# Patient Record
Sex: Male | Born: 1985 | Race: White | Hispanic: No | Marital: Married | Smoking: Current every day smoker
Health system: Southern US, Community
[De-identification: ages and names within clinical notes are randomized; demographics above are authoritative.]

## PROBLEM LIST (undated history)

## (undated) DIAGNOSIS — Z8719 Personal history of other diseases of the digestive system: Secondary | ICD-10-CM

## (undated) DIAGNOSIS — F419 Anxiety disorder, unspecified: Secondary | ICD-10-CM

## (undated) HISTORY — PX: NO PAST SURGERIES: SHX2092

---

## 2014-08-15 ENCOUNTER — Emergency Department (HOSPITAL_COMMUNITY)
Admission: EM | Admit: 2014-08-15 | Discharge: 2014-08-15 | Payer: Self-pay | Attending: Emergency Medicine | Admitting: Emergency Medicine

## 2014-08-15 ENCOUNTER — Encounter (HOSPITAL_COMMUNITY): Payer: Self-pay | Admitting: Emergency Medicine

## 2014-08-15 DIAGNOSIS — S0181XA Laceration without foreign body of other part of head, initial encounter: Secondary | ICD-10-CM | POA: Insufficient documentation

## 2014-08-15 DIAGNOSIS — R42 Dizziness and giddiness: Secondary | ICD-10-CM | POA: Insufficient documentation

## 2014-08-15 DIAGNOSIS — Z720 Tobacco use: Secondary | ICD-10-CM | POA: Insufficient documentation

## 2014-08-15 DIAGNOSIS — T07XXXA Unspecified multiple injuries, initial encounter: Secondary | ICD-10-CM

## 2014-08-15 MED ORDER — ACETAMINOPHEN 325 MG PO TABS
650.0000 mg | ORAL_TABLET | Freq: Once | ORAL | Status: AC
  Administered 2014-08-15: 650 mg via ORAL
  Filled 2014-08-15: qty 2

## 2014-08-15 MED ORDER — LIDOCAINE-EPINEPHRINE (PF) 2 %-1:200000 IJ SOLN
10.0000 mL | Freq: Once | INTRAMUSCULAR | Status: AC
  Administered 2014-08-15: 10 mL
  Filled 2014-08-15: qty 10

## 2014-08-15 NOTE — ED Notes (Signed)
Irving BurtonEmily, PA at bedside to suture.
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
## 2014-08-15 NOTE — Discharge Instructions (Signed)
Read the information below.  You may return to the Emergency Department at any time for worsening condition or any new symptoms that concern you.  If you develop redness, swelling, pus draining from the wound, or fevers greater than 100.4, return to the ER immediately for a recheck.    Facial Laceration A facial laceration is a cut on the face. These injuries can be painful and cause bleeding. Some cuts may need to be closed with stitches (sutures), skin adhesive strips, or wound glue. Cuts usually heal quickly but can leave a scar. It can take 1-2 years for the scar to go away completely. HOME CARE   Only take medicines as told by your doctor.  Follow your doctor's instructions for wound care. For Stitches:  Keep the cut clean and dry.  If you have a bandage (dressing), change it at least once a day. Change the bandage if it gets wet or dirty, or as told by your doctor.  Wash the cut with soap and water 2 times a day. Rinse the cut with water. Pat it dry with a clean towel.  Put a thin layer of medicated cream on the cut as told by your doctor.  You may shower after the first 24 hours. Do not soak the cut in water until the stitches are removed.  Have your stitches removed as told by your doctor.  Do not wear any makeup until a few days after your stitches are removed. For Skin Adhesive Strips:  Keep the cut clean and dry.  Do not get the strips wet. You may take a bath, but be careful to keep the cut dry.  If the cut gets wet, pat it dry with a clean towel.  The strips will fall off on their own. Do not remove the strips that are still stuck to the cut. For Wound Glue:  You may shower or take baths. Do not soak or scrub the cut. Do not swim. Avoid heavy sweating until the glue falls off on its own. After a shower or bath, pat the cut dry with a clean towel.  Do not put medicine or makeup on your cut until the glue falls off.  If you have a bandage, do not put tape over the  glue.  Avoid lots of sunlight or tanning lamps until the glue falls off.  The glue will fall off on its own in 5-10 days. Do not pick at the glue. After Healing: Put sunscreen on the cut for the first year to reduce your scar. GET HELP RIGHT AWAY IF:   Your cut area gets red, painful, or puffy (swollen).  You see a yellowish-white fluid (pus) coming from the cut.  You have chills or a fever. MAKE SURE YOU:   Understand these instructions.  Will watch your condition.  Will get help right away if you are not doing well or get worse. Document Released: 03/29/2008 Document Revised: 08/01/2013 Document Reviewed: 05/24/2013 [facility] Patient Information 2015 [place], [place]. This information is not intended to replace advice given to you by your health care provider. Make sure you discuss any questions you have with your health care provider.  Laceration Care, Adult A laceration is a cut that goes through all layers of the skin. The cut goes into the tissue beneath the skin. HOME CARE For stitches (sutures) or staples:  Keep the cut clean and dry.  If you have a bandage (dressing), change it at least once a day. Change the bandage if  it gets wet or dirty, or as told by your doctor.  Wash the cut with soap and water 2 times a day. Rinse the cut with water. Pat it dry with a clean towel.  Put a thin layer of medicated cream on the cut as told by your doctor.  You may shower after the first 24 hours. Do not soak the cut in water until the stitches are removed.  Only take medicines as told by your doctor.  Have your stitches or staples removed as told by your doctor. For skin adhesive strips:  Keep the cut clean and dry.  Do not get the strips wet. You may take a bath, but be careful to keep the cut dry.  If the cut gets wet, pat it dry with a clean towel.  The strips will fall off on their own. Do not remove the strips that are still stuck to the cut. For wound glue:  You  may shower or take baths. Do not soak or scrub the cut. Do not swim. Avoid heavy sweating until the glue falls off on its own. After a shower or bath, pat the cut dry with a clean towel.  Do not put medicine on your cut until the glue falls off.  If you have a bandage, do not put tape over the glue.  Avoid lots of sunlight or tanning lamps until the glue falls off. Put sunscreen on the cut for the first year to reduce your scar.  The glue will fall off on its own. Do not pick at the glue. You may need a tetanus shot if:  You cannot remember when you had your last tetanus shot.  You have never had a tetanus shot. If you need a tetanus shot and you choose not to have one, you may get tetanus. Sickness from tetanus can be serious. GET HELP RIGHT AWAY IF:   Your pain does not get better with medicine.  Your arm, hand, leg, or foot loses feeling (numbness) or changes color.  Your cut is bleeding.  Your joint feels weak, or you cannot use your joint.  You have painful lumps on your body.  Your cut is red, puffy (swollen), or painful.  You have a red line on the skin near the cut.  You have yellowish-white fluid (pus) coming from the cut.  You have a fever.  You have a bad smell coming from the cut or bandage.  Your cut breaks open before or after stitches are removed.  You notice something coming out of the cut, such as wood or glass.  You cannot move a finger or toe. MAKE SURE YOU:   Understand these instructions.  Will watch your condition.  Will get help right away if you are not doing well or get worse. Document Released: 03/29/2008 Document Revised: 01/03/2012 Document Reviewed: 04/06/2011 [facility] Patient Information 2015 [place], [place]. This information is not intended to replace advice given to you by your health care provider. Make sure you discuss any questions you have with your health care provider.

## 2014-08-15 NOTE — ED Provider Notes (Signed)
Medical screening examination/treatment/procedure(s) were performed by non-physician practitioner and as supervising physician I was immediately available for consultation/collaboration.   EKG Interpretation None        [name], MD 08/15/14 2354

## 2015-03-14 ENCOUNTER — Encounter (HOSPITAL_COMMUNITY): Payer: Self-pay | Admitting: *Deleted

## 2015-03-14 ENCOUNTER — Inpatient Hospital Stay (HOSPITAL_COMMUNITY)
Admission: AD | Admit: 2015-03-14 | Discharge: 2015-03-18 | DRG: 880 | Disposition: A | Discharge status: Home / Self Care | Payer: Federal, State, Local not specified - Other | Source: Intra-hospital | Attending: Psychiatry | Admitting: Psychiatry

## 2015-03-14 ENCOUNTER — Emergency Department (HOSPITAL_COMMUNITY)
Admission: EM | Admit: 2015-03-14 | Discharge: 2015-03-14 | Disposition: A | Discharge status: Other Acute Inpatient Hospital | Payer: Self-pay

## 2015-03-14 DIAGNOSIS — R45850 Homicidal ideations: Secondary | ICD-10-CM | POA: Diagnosis present

## 2015-03-14 DIAGNOSIS — F419 Anxiety disorder, unspecified: Secondary | ICD-10-CM | POA: Diagnosis present

## 2015-03-14 DIAGNOSIS — F411 Generalized anxiety disorder: Secondary | ICD-10-CM | POA: Diagnosis not present

## 2015-03-14 DIAGNOSIS — F17210 Nicotine dependence, cigarettes, uncomplicated: Secondary | ICD-10-CM | POA: Diagnosis present

## 2015-03-14 DIAGNOSIS — G4700 Insomnia, unspecified: Secondary | ICD-10-CM | POA: Diagnosis present

## 2015-03-14 DIAGNOSIS — F410 Panic disorder [episodic paroxysmal anxiety] without agoraphobia: Secondary | ICD-10-CM | POA: Diagnosis present

## 2015-03-14 DIAGNOSIS — F4310 Post-traumatic stress disorder, unspecified: Secondary | ICD-10-CM | POA: Diagnosis present

## 2015-03-14 DIAGNOSIS — F319 Bipolar disorder, unspecified: Secondary | ICD-10-CM

## 2015-03-14 DIAGNOSIS — Z720 Tobacco use: Secondary | ICD-10-CM | POA: Insufficient documentation

## 2015-03-14 HISTORY — DX: Anxiety disorder, unspecified: F41.9

## 2015-03-14 HISTORY — DX: Personal history of other diseases of the digestive system: Z87.19

## 2015-03-14 LAB — CBC WITH DIFFERENTIAL/PLATELET
Basophils Absolute: 0 K/uL (ref 0.0–0.1)
Basophils Relative: 0 % (ref 0–1)
Eosinophils Absolute: 0 K/uL (ref 0.0–0.7)
Eosinophils Relative: 0 % (ref 0–5)
HCT: 42.2 % (ref 39.0–52.0)
Hemoglobin: 14.5 g/dL (ref 13.0–17.0)
Lymphocytes Relative: 12 % (ref 12–46)
Lymphs Abs: 1.1 K/uL (ref 0.7–4.0)
MCH: 32.3 pg (ref 26.0–34.0)
MCHC: 34.4 g/dL (ref 30.0–36.0)
MCV: 94 fL (ref 78.0–100.0)
Monocytes Absolute: 0.7 K/uL (ref 0.1–1.0)
Monocytes Relative: 8 % (ref 3–12)
Neutro Abs: 7.1 K/uL (ref 1.7–7.7)
Neutrophils Relative %: 80 % — ABNORMAL HIGH (ref 43–77)
Platelets: 252 K/uL (ref 150–400)
RBC: 4.49 MIL/uL (ref 4.22–5.81)
RDW: 13.3 % (ref 11.5–15.5)
WBC: 8.9 K/uL (ref 4.0–10.5)

## 2015-03-14 LAB — COMPREHENSIVE METABOLIC PANEL WITH GFR
ALT: 21 U/L (ref 17–63)
AST: 24 U/L (ref 15–41)
Albumin: 4 g/dL (ref 3.5–5.0)
Alkaline Phosphatase: 52 U/L (ref 38–126)
Anion gap: 12 (ref 5–15)
BUN: 10 mg/dL (ref 6–20)
CO2: 22 mmol/L (ref 22–32)
Calcium: 8.9 mg/dL (ref 8.9–10.3)
Chloride: 106 mmol/L (ref 101–111)
Creatinine, Ser: 1.13 mg/dL (ref 0.61–1.24)
GFR calc Af Amer: >60 mL/min
GFR calc non Af Amer: >60 mL/min
Glucose, Bld: 88 mg/dL (ref 65–99)
Potassium: 3.7 mmol/L (ref 3.5–5.1)
Sodium: 140 mmol/L (ref 135–145)
Total Bilirubin: 0.6 mg/dL (ref 0.3–1.2)
Total Protein: 7.1 g/dL (ref 6.5–8.1)

## 2015-03-14 LAB — RAPID URINE DRUG SCREEN, HOSP PERFORMED
Amphetamines: NOT DETECTED
BARBITURATES: NOT DETECTED
BENZODIAZEPINES: NOT DETECTED
COCAINE: NOT DETECTED
Opiates: NOT DETECTED
Tetrahydrocannabinol: POSITIVE — AB

## 2015-03-14 LAB — ETHANOL: Alcohol, Ethyl (B): <5 mg/dL (ref <5)

## 2015-03-14 LAB — VALPROIC ACID LEVEL: VALPROIC ACID LVL: 30 ug/mL — AB (ref 50.0–100.0)

## 2015-03-14 MED ORDER — BUSPIRONE HCL 10 MG PO TABS
10.0000 mg | ORAL_TABLET | Freq: Two times a day (BID) | ORAL | Status: DC
  Administered 2015-03-14 – 2015-03-15 (×2): 10 mg via ORAL
  Filled 2015-03-14: qty 2
  Filled 2015-03-14 (×6): qty 1

## 2015-03-14 MED ORDER — NICOTINE 21 MG/24HR TD PT24
21.0000 mg | MEDICATED_PATCH | Freq: Every day | TRANSDERMAL | Status: DC
  Administered 2015-03-15 – 2015-03-17 (×3): 21 mg via TRANSDERMAL
  Filled 2015-03-14 (×6): qty 1

## 2015-03-14 MED ORDER — ALUM & MAG HYDROXIDE-SIMETH 200-200-20 MG/5ML PO SUSP: 30.0000 mL | ORAL | Status: DC | PRN

## 2015-03-14 MED ORDER — ACETAMINOPHEN 325 MG PO TABS: 650.0000 mg | ORAL_TABLET | ORAL | Status: DC | PRN

## 2015-03-14 MED ORDER — TRAZODONE HCL 50 MG PO TABS: 50.0000 mg | ORAL_TABLET | Freq: Every evening | ORAL | Status: DC | PRN

## 2015-03-14 MED ORDER — DIVALPROEX SODIUM 250 MG PO DR TAB
250.0000 mg | DELAYED_RELEASE_TABLET | Freq: Two times a day (BID) | ORAL | Status: DC
  Administered 2015-03-14: 250 mg via ORAL
  Filled 2015-03-14: qty 1

## 2015-03-14 MED ORDER — BUSPIRONE HCL 10 MG PO TABS
10.0000 mg | ORAL_TABLET | Freq: Two times a day (BID) | ORAL | Status: DC
  Administered 2015-03-14: 10 mg via ORAL
  Filled 2015-03-14: qty 1

## 2015-03-14 MED ORDER — TRAZODONE HCL 50 MG PO TABS: 150.0000 mg | ORAL_TABLET | Freq: Every day | ORAL | Status: DC

## 2015-03-14 MED ORDER — HYDROXYZINE HCL 25 MG PO TABS: 25.0000 mg | ORAL_TABLET | ORAL | Status: DC | PRN

## 2015-03-14 MED ORDER — MAGNESIUM HYDROXIDE 400 MG/5ML PO SUSP: 30.0000 mL | Freq: Every day | ORAL | Status: DC | PRN

## 2015-03-14 MED ORDER — ACETAMINOPHEN 325 MG PO TABS
650.0000 mg | ORAL_TABLET | Freq: Four times a day (QID) | ORAL | Status: DC | PRN
  Administered 2015-03-15 – 2015-03-18 (×3): 650 mg via ORAL
  Filled 2015-03-14 (×3): qty 2

## 2015-03-14 MED ORDER — CITALOPRAM HYDROBROMIDE 10 MG PO TABS: 40.0000 mg | ORAL_TABLET | Freq: Every day | ORAL | Status: DC

## 2015-03-14 MED ORDER — TRAZODONE HCL 150 MG PO TABS
150.0000 mg | ORAL_TABLET | Freq: Every day | ORAL | Status: DC
  Administered 2015-03-14 – 2015-03-17 (×4): 150 mg via ORAL
  Filled 2015-03-14 (×7): qty 1

## 2015-03-14 MED ORDER — NICOTINE 21 MG/24HR TD PT24
21.0000 mg | MEDICATED_PATCH | Freq: Every day | TRANSDERMAL | Status: DC
  Filled 2015-03-14 (×3): qty 1
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
## 2015-03-14 NOTE — ED Notes (Signed)
Staffing office notified for pt.'s sitter , paper scrubs given to pt. security paged to wand pt.

## 2015-03-14 NOTE — Progress Notes (Signed)
LCSW met with patient at the bedside. Agreeable to transfer to [facility] this morning. Signed voluntary paperwork and consents Patient going to 400-1 Dr. Sindy Messing. [facility] will transfer patient.  Patient educated on facility, voluntary admission, and rules. Reports he was at [facility] for 2 months early in the year. Reports he follows at [facility] for medications.  Next appointment he reports is in July.  Lane Hacker, MSW Clinical Social Work: Emergency Room [phone]

## 2015-03-14 NOTE — ED Notes (Signed)
Talked with Timothy Pearson, [facility], just need pt to urinate and need results before patient can be transferred.
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
## 2015-03-14 NOTE — ED Notes (Signed)
Security wanded pt. at triage. 

## 2015-03-14 NOTE — ED Notes (Signed)
Pt. reports homicidal ideation towards his wife , pt. did not disclose plan of homicide , denies suicidal ideation ,  No hallucinations .

## 2015-03-14 NOTE — [facility] Group Notes (Signed)
[facility] LCSW Group Therapy  Feelings Around Diagnosis 1:15 - 2:30 PM  03/14/2015 3:25 PM  Type of Therapy:  Group Therapy  Participation Level:  Did Not Attend   Wynn BankerHodnett, Bobbi Kozakiewicz Hairston 03/14/2015, 3:25 PM
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
## 2015-03-14 NOTE — ED Provider Notes (Addendum)
This chart was scribed for Timothy Pearson Lenus Trauger, DO by Lyndel SafeKaitlyn Shelton, ED Scribe. This patient was seen in room A13C/A13C and the patient's care was started 2:50 AM.  TIME SEEN: 2:50 AM  CHIEF COMPLAINT: homicidal thoughts toward wife.   HPI:  HPI Comments: Thurston IV Atha StarksWelborn is a 29 y.o. male who presents to the Emergency Department complaining of homicidal thoughts towards his wife since this morning. Pt reports to having similar thoughts in the past and has been placed in inpatient psychiatric treatment. Pt is on psychiatric medication and claims to have not missed a dose recently. He denies SI, A/V hallucinations, fever, nausea, vomiting, diarrhea, and pain. Denies drug or alcohol use.  ROS: See HPI Constitutional: no fever  Eyes: no drainage  ENT: no runny nose   Cardiovascular:  no chest pain  Resp: no SOB  GI: no vomiting GU: no dysuria Integumentary: no rash  Allergy: no hives  Musculoskeletal: no leg swelling  Neurological: no slurred speech ROS otherwise negative  PAST MEDICAL HISTORY/PAST SURGICAL HISTORY:  No past medical history on file.  MEDICATIONS:  Prior to Admission medications   Not on File    ALLERGIES:  No Known Allergies  SOCIAL HISTORY:  History  Substance Use Topics  . Smoking status: Current Every Day Smoker -- 0.50 packs/day    Types: Cigarettes  . Smokeless tobacco: Not on file  . Alcohol Use: Yes     Comment: regularly    FAMILY HISTORY: No family history on file.  EXAM: Triage Vitals: BP 128/81 mmHg  Pulse 113  Temp(Src) 98.8 F (37.1 C) (Oral)  Resp 20  Wt 162 lb 3.2 oz (73.573 kg)  SpO2 99%  CONSTITUTIONAL: Alert and oriented and responds appropriately to questions. Well-appearing; well-nourished HEAD: Normocephalic EYES: Conjunctivae clear, PERRL ENT: normal nose; no rhinorrhea; moist mucous membranes; pharynx without lesions noted NECK: Supple, no meningismus, no LAD  CARD: RRR; S1 and S2 appreciated; no murmurs, no clicks, no  rubs, no gallops RESP: Normal chest excursion without splinting or tachypnea; breath sounds clear and equal bilaterally; no wheezes, no rhonchi, no rales, no hypoxia or respiratory distress, speaking full sentences ABD/GI: Normal bowel sounds; non-distended; soft, non-tender, no rebound, no guarding, no peritoneal signs BACK:  The back appears normal and is non-tender to palpation, there is no CVA tenderness EXT: Normal ROM in all joints; non-tender to palpation; no edema; normal capillary refill; no cyanosis, no calf tenderness or swelling    SKIN: Normal color for age and race; warm NEURO: Moves all extremities equally, sensation to light touch intact diffusely, cranial nerves II through XII intact PSYCH: The patient's mood and manner are appropriate. Grooming and personal hygiene are appropriate.  MEDICAL DECISION MAKING: Patient here with homicidal thoughts towards his wife. Will not disclose plan. No SI. No hallucinations. No medical complaints. Screening labs unremarkable. TTS consult.   6:40 AM  [facility].  I personally performed the services described in this documentation, which was scribed in my presence. The recorded information has been reviewed and is accurate.     Timothy Pearson Caymen Dubray, DO 03/14/15 [phone]  Timothy Pearson Ethyl Vila, DO 03/14/15 [phone]

## 2015-03-14 NOTE — ED Notes (Addendum)
Awaiting TTS/cart set up at bedside
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
## 2015-03-15 ENCOUNTER — Encounter (HOSPITAL_COMMUNITY): Payer: Self-pay | Admitting: Nurse Practitioner

## 2015-03-15 DIAGNOSIS — F319 Bipolar disorder, unspecified: Secondary | ICD-10-CM

## 2015-03-15 DIAGNOSIS — F411 Generalized anxiety disorder: Principal | ICD-10-CM

## 2015-03-15 MED ORDER — DIVALPROEX SODIUM 500 MG PO DR TAB
500.0000 mg | DELAYED_RELEASE_TABLET | Freq: Every day | ORAL | Status: DC
  Administered 2015-03-15 – 2015-03-17 (×3): 500 mg via ORAL
  Filled 2015-03-15 (×5): qty 1

## 2015-03-15 MED ORDER — BUSPIRONE HCL 15 MG PO TABS
15.0000 mg | ORAL_TABLET | Freq: Two times a day (BID) | ORAL | Status: DC
  Administered 2015-03-15 – 2015-03-18 (×6): 15 mg via ORAL
  Filled 2015-03-15 (×10): qty 1

## 2015-03-15 MED ORDER — CITALOPRAM HYDROBROMIDE 40 MG PO TABS
40.0000 mg | ORAL_TABLET | Freq: Every day | ORAL | Status: DC
  Administered 2015-03-15 – 2015-03-18 (×4): 40 mg via ORAL
  Filled 2015-03-15 (×5): qty 1
  Filled 2015-03-15: qty 2

## 2015-03-15 MED ORDER — DIVALPROEX SODIUM 250 MG PO DR TAB
250.0000 mg | DELAYED_RELEASE_TABLET | Freq: Every morning | ORAL | Status: DC
  Administered 2015-03-16 – 2015-03-18 (×3): 250 mg via ORAL
  Filled 2015-03-15 (×4): qty 1

## 2015-03-15 MED ORDER — GABAPENTIN 300 MG PO CAPS
300.0000 mg | ORAL_CAPSULE | Freq: Three times a day (TID) | ORAL | Status: DC
  Administered 2015-03-15 – 2015-03-18 (×9): 300 mg via ORAL
  Filled 2015-03-15 (×12): qty 1

## 2015-03-15 MED ORDER — HYDROXYZINE HCL 50 MG PO TABS
50.0000 mg | ORAL_TABLET | ORAL | Status: DC | PRN
  Administered 2015-03-15 – 2015-03-18 (×3): 50 mg via ORAL
  Filled 2015-03-15 (×3): qty 1
  Filled 2015-03-15: qty 20

## 2015-03-15 NOTE — Progress Notes (Signed)
D: Pt was guarded and isolated to his room this evening. Pt did not forward in interaction with Clinical research associatewriter. Pt denied any SI/HI/AVH.  A:Continued support and availability as needed was extended to this pt. Staff continue to monitor pt with q7715min checks.  R: No adverse drug reactions noted. Pt receptive to treatment. Pt remains safe at this time.

## 2015-03-15 NOTE — [facility] Group Notes (Signed)
The focus of this group is to educate the patient on the purpose and policies of crisis stabilization and provide a format to answer questions about their admission.  The group details unit policies and expectations of patients while admitted.  Patient did not attend this morning's coping skills and crisis planning groups.  Patient stayed in bed.
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
## 2015-03-15 NOTE — [facility] Group Notes (Signed)
[facility] Group Notes: (Clinical Social Work)   03/15/2015      Type of Therapy:  Group Therapy   Participation Level:  Did Not Attend despite MHT prompting   Eeva Schlosser Grossman-Orr, LCSW 03/15/2015, 4:53 PM     

## 2015-03-15 NOTE — Progress Notes (Signed)
D:  Patient denied SI and HI, contracts for safety.  Denied A/V hallucinations.  Denied pain.  Patient was very irritable this morning, stated he was sleepy and did not want to get out of bed.  Patient went to lunch in dining room. A:  Medications administered per MD orders.  Emotional support and encouragement given patient. R:  Safety maintained with 15 minute checks.

## 2015-03-15 NOTE — Plan of Care (Signed)
Problem: Consults Goal: Anxiety Disorder Patient Education See Patient Education Module for eduction specifics.  Outcome: Completed/Met Date Met:  03/15/15 Nurse discussed anxiety/coping skills with patient.

## 2015-03-15 NOTE — Progress Notes (Signed)
D.  Pt pleasant on approach, no complaints voiced initially.  After group, Pt had loud phone conversation and then requested medication for anxiety.  Pt was still pleasant but had been overheard arguing loudly on the phone by several staff members.  Pt was able to calm down on his own.  Interacting appropriately with peers on the unit.  Denies SI/HI/hallucinations at this time.  A.  Support and encouragement offered, medication given as ordered  R.  Pt remains safe on the unit, will continue to monitor.

## 2015-03-16 DIAGNOSIS — F319 Bipolar disorder, unspecified: Secondary | ICD-10-CM

## 2015-03-16 NOTE — Progress Notes (Signed)
Psychoeducational Group Note  Date:  03/16/2015 Time:  1015  Group Topic/Focus:  Making Healthy Choices:   The focus of this group is to help patients identify negative/unhealthy choices they were using prior to admission and identify positive/healthier coping strategies to replace them upon discharge.  Participation Level:  Active  Participation Quality:  Appropriate  Affect:  Appropriate  Cognitive:  Oriented  Insight:  Improving  Engagement in Group:  Engaged  Additional Comments:  Pt participated in the group, and was able to identify needs that they have. They also were able to identify areas that they needed to grow in.  Azzan Butler A 03/16/2015 

## 2015-03-16 NOTE — [facility] Group Notes (Signed)
[facility] Group Notes: (Clinical Social Work)   03/16/2015      Type of Therapy:  Group Therapy   Participation Level:  Did Not Attend despite MHT prompting   Ambrose MantleMareida Grossman-Orr, LCSW 03/16/2015, 3:42 PM

## 2015-03-16 NOTE — Progress Notes (Signed)
[facility] MD Progress Note  03/16/2015 10:52 AM LJ MIYAMOTO  MRN:  [id number] Subjective:  "Im feeling fine, and I have a little stress and anxiety today," patient states he checked himself into the hospital to avoid "going in the wrong direction" "I'm better, I'm thinking clearer and I no longer want to hurt anyone else." Principal Problem: Anxiety disorder Objective: Timothy Pearson is a 29 year old MWM who reports that he is doing better with his homicidal ideation and appears motivated to continue his medication and stay on a positive therapeutic track. He feels that coming to the hospital was the right decision rather than going to jail for consequences of his actions. He has attended some of the unit programming but was irritable yesterday, his behavior on the unit has been appropriate so far.  Diagnosis:   Patient Active Problem List   Diagnosis Date Noted  . Bipolar disorder [F31.9] 03/15/2015  . Anxiety disorder [F41.9] 03/14/2015   Total Time spent with patient: 20 minutes   Past Medical History:  Past Medical History  Diagnosis Date  . Anxiety   . History of hiatal hernia     Past Surgical History  Procedure Laterality Date  . No past surgeries     Family History: History reviewed. No pertinent family history. Social History:  History  Alcohol Use No    Comment: none since October      History  Drug Use  . Yes  . Special: Marijuana    Comment: "3 blunts/day"    History   Social History  . Marital Status: Single    Spouse Name: N/A  . Number of Children: N/A  . Years of Education: N/A   Social History Main Topics  . Smoking status: Current Every Day Smoker -- 0.50 packs/day    Types: Cigarettes  . Smokeless tobacco: Never Used  . Alcohol Use: No     Comment: none since October   . Drug Use: Yes    Special: Marijuana     Comment: "3 blunts/day"  . Sexual Activity: Yes   Other Topics Concern  . None   Social History Narrative   Additional History:    Sleep:  Good  Appetite:  Good   Assessment:   Musculoskeletal: Strength & Muscle Tone: within normal limits Gait & Station: normal Patient leans: normal   Psychiatric Specialty Exam: Physical Exam  Review of Systems  All other systems reviewed and are negative.   Blood pressure 122/71, pulse 79, temperature 98 F (36.7 C), temperature source Oral, resp. rate 18, height  (1.727 m), weight 153 lb (69.4 kg), SpO2 100 %.Body mass index is 23.27 kg/(m^2).  General Appearance: Casual and Fairly Groomed  Patent attorney::  Good  Speech:  Clear and Coherent and Normal Rate  Volume:  Normal  Mood:  Anxious  Affect:  Congruent  Thought Process:  Coherent, Goal Directed, Intact, Linear and Logical  Orientation:  Full (Time, Place, and Person)  Thought Content:  WDL  Suicidal Thoughts:  No  Homicidal Thoughts:  No "I don't any more."  Memory:  Immediate;   Fair Recent;   Fair Remote;   Fair  Judgement:  Intact  Insight:  Present  Psychomotor Activity:  Normal  Concentration:  Fair  Recall:  [employer] of Knowledge:Fair  Language: Good  Akathisia:  No  Handed:  Right  AIMS (if indicated):     Assets:  Communication Skills Desire for Improvement  ADL's:  Intact  Cognition: WNL  Sleep:  Number of Hours: 5.75     Current Medications: Current Facility-Administered Medications  Medication Dose Route Frequency Provider Last Rate Last Dose  . acetaminophen (TYLENOL) tablet 650 mg  650 mg Oral Q6H PRN Sanjuana KavaAgnes I Nwoko, NP   650 mg at 03/15/15 1207  . alum & mag hydroxide-simeth (MAALOX/MYLANTA) 200-200-20 MG/5ML suspension 30 mL  30 mL Oral Q4H PRN Sanjuana KavaAgnes I Nwoko, NP      . busPIRone (BUSPAR) tablet 15 mg  15 mg Oral BID Adonis BrookSheila Agustin, NP   15 mg at 03/16/15 0802  . citalopram (CELEXA) tablet 40 mg  40 mg Oral Daily Adonis BrookSheila Agustin, NP   40 mg at 03/16/15 0803  . divalproex (DEPAKOTE) DR tablet 250 mg  250 mg Oral q morning - 10a Adonis BrookSheila Agustin, NP   250 mg at 03/16/15 0802  . divalproex  (DEPAKOTE) DR tablet 500 mg  500 mg Oral QHS Adonis BrookSheila Agustin, NP   500 mg at 03/15/15 2138  . gabapentin (NEURONTIN) capsule 300 mg  300 mg Oral TID Adonis BrookSheila Agustin, NP   300 mg at 03/16/15 0802  . hydrOXYzine (ATARAX/VISTARIL) tablet 50 mg  50 mg Oral Q4H PRN Adonis BrookSheila Agustin, NP   50 mg at 03/15/15 2138  . magnesium hydroxide (MILK OF MAGNESIA) suspension 30 mL  30 mL Oral Daily PRN Sanjuana KavaAgnes I Nwoko, NP      . nicotine (NICODERM CQ - dosed in mg/24 hours) patch 21 mg  21 mg Transdermal Q0600 Sanjuana KavaAgnes I Nwoko, NP   21 mg at 03/16/15 0803  . traZODone (DESYREL) tablet 150 mg  150 mg Oral QHS Sanjuana KavaAgnes I Nwoko, NP   150 mg at 03/15/15 2229    Lab Results: No results found for this or any previous visit (from the past 48 hour(s)).  Physical Findings: AIMS: Facial and Oral Movements Muscles of Facial Expression: None, normal Lips and Perioral Area: None, normal Jaw: None, normal Tongue: None, normal,Extremity Movements Upper (arms, wrists, hands, fingers): None, normal Lower (legs, knees, ankles, toes): None, normal, Trunk Movements Neck, shoulders, hips: None, normal, Overall Severity Severity of abnormal movements (highest score from questions above): None, normal Incapacitation due to abnormal movements: None, normal Patient's awareness of abnormal movements (rate only patient's report): No Awareness, Dental Status Current problems with teeth and/or dentures?: No Does patient usually wear dentures?: No  CIWA:  CIWA-Ar Total: 1 COWS:  COWS Total Score: 1  Treatment Plan Summary: Daily contact with patient to assess and evaluate symptoms and progress in treatment and Medication management  1. Continue crisis management and stabilization. 2. Continue medication management by assessing side effects, dose modification as needed and therapeutic blood levels as indicated. 3. Treat health problems as indicated or consult IM as needed. 4. Continue treatment plan to decrease risk of relapse upon discharge  and to reduce the need for readmission to incorporate the use of local resources for support. 5. Psycho-social education regarding relapse prevention through good self care to incorporate diet, exercise, stress reduction, good sleep hygiene, and reduction in external risk factors such as alcohol, tobacco, and substance abuse. 6. Gain consent for contact with family, PCP, or outside health provider as needed. 7. Review home medications as needed.   Medical Decision Making:  Established Problem, Stable/Improving (1)     MASHBURN,NEIL 03/16/2015, 10:52 AM I reviewed chart and agreed with the findings and treatment Plan.  Kathryne SharperSyed Hanah Moultry, MD

## 2015-03-17 DIAGNOSIS — F419 Anxiety disorder, unspecified: Secondary | ICD-10-CM

## 2015-03-17 NOTE — Progress Notes (Signed)
D Pt. Denies SI and HI, no complaints of pain or discomfort noted this pm  A Writer offered support and encouragement, discussed coping skills and discharge plans with pt.  R Pt. Reports he is discharging tomorrow, rates his day an 8 with 10 being the best, his anxiety is down to a 3 from a 10 and he rates his depression a 0. Pt. States he has learned to step back from the situation when he is angry admitting he has a lot of social anxiety. Pt. Remains safe on the unit.
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
## 2015-03-17 NOTE — Progress Notes (Signed)
D: Patient seen on day room socializing with peers. Patient reports a chronic back pain of 5/10 and requested for "tylenol". Reports anxiety 5/10 and got PRN of vistaril. Patient stated that he's working on his goal "think positive and on socialization". Denies SI, AH/VH. Pleasant and cooperative. A: Support and encouragement offered to patient. Due medications given as ordered. [id number] minutes check for safety maintained. Will continue to monitor patient for stability.  R: Patient receptive to nursing interventions.

## 2015-03-17 NOTE — [facility] Group Notes (Signed)
[facility]   03/17/2015 11:49 AM    Participation Quality:  Appropraite  Mood/Affect:  Appropriate  Depression Rating:  0  Anxiety Rating:  0  Thoughts of Suicide:  No  Will you contract for safety?   NA  Current AVH:  No  Plan for Discharge/Comments:  Patient attended discharge planning group and actively participated in group. He reports doing well and hopes to discharge.  He will follow up with [facility].  Suicide prevention education reviewed and SPE document provided.   Transportation Means: Patient has transportation.   Supports:  Patient has a support system.   Akire Rennert, Joesph JulyQuylle Hairston

## 2015-03-17 NOTE — Progress Notes (Signed)
Recreation Therapy Notes  Date: 03/17/15 Time: 9:30am Location: [address] Dayroom  Group Topic: Stress Management  Goal Area(s) Addresses:  Patient will verbalize importance of using healthy stress management.  Patient will identify positive emotions associated with healthy stress management.   Intervention: Stress Management  Activity :  Guided Imagery.  LRT introduced and educated patients on stress management technique of guided imagery.  Scripts were used to deliver the technique to patients, patients were asked to follow script read allowed by LRT to engage in practicing the stress management technique.  Education:  Stress Management, Discharge Planning.   Education Outcome: Needs additional education  Clinical Observations/Feedback:  Patient did not attend.   Martha Soltys, LRT/CTRS         Nahomi Hegner A 03/17/2015 3:44 PM 

## 2015-03-17 NOTE — Progress Notes (Addendum)
D:  Per pt self inventory pt reports sleeping good, appetite fair, energy level normal, ability to pay attention poor, rates depression at a 0 out of 10, hopelessness at a 0 out of 10, anxiety at a 6 out of 10, anxious during interaction, assertive, denies SI/HI/AVH, goal today:  Work on Pharmacist, communitysocial skills and talk to people.   A:  Emotional support provided, Encouraged pt to continue with treatment plan and attend all group activities, q15 min checks maintained for safety.  R:  Pt is receptive, going to groups, pleasant and cooperative with staff and other patients on the unit.

## 2015-03-18 LAB — VALPROIC ACID LEVEL: Valproic Acid Lvl: 69 ug/mL (ref 50.0–100.0)

## 2015-03-18 MED ORDER — DIVALPROEX SODIUM 250 MG PO DR TAB: DELAYED_RELEASE_TABLET | ORAL | Status: DC

## 2015-03-18 MED ORDER — CITALOPRAM HYDROBROMIDE 40 MG PO TABS: 40.0000 mg | ORAL_TABLET | Freq: Every day | ORAL | Status: DC

## 2015-03-18 MED ORDER — TRAZODONE HCL 150 MG PO TABS: 150.0000 mg | ORAL_TABLET | Freq: Every day | ORAL | Status: DC

## 2015-03-18 MED ORDER — GABAPENTIN 300 MG PO CAPS: 300.0000 mg | ORAL_CAPSULE | Freq: Three times a day (TID) | ORAL | Status: DC

## 2015-03-18 MED ORDER — BUSPIRONE HCL 15 MG PO TABS: 15.0000 mg | ORAL_TABLET | Freq: Two times a day (BID) | ORAL | Status: DC

## 2015-03-18 MED ORDER — NICOTINE 21 MG/24HR TD PT24: 21.0000 mg | MEDICATED_PATCH | Freq: Every day | TRANSDERMAL | Status: DC

## 2015-03-18 MED ORDER — HYDROXYZINE HCL 50 MG PO TABS: 50.0000 mg | ORAL_TABLET | ORAL | Status: DC | PRN

## 2015-03-18 NOTE — Progress Notes (Signed)
Adult Psychoeducational Group Note  Date:  03/18/2015 Time:  0900  Group Topic/Focus:  Orientation:   The focus of this group is to educate the patient on the purpose and policies of crisis stabilization and provide a format to answer questions about their admission.  The group details unit policies and expectations of patients while admitted.  Participation Level:  Active  Participation Quality:  Appropriate  Affect:  Appropriate  Cognitive:  Appropriate  Insight: Appropriate  Engagement in Group:  Engaged  Modes of Intervention:  Education  Additional Comments:    Ifeanyi Mickelson L 03/18/2015, 9:51 AM

## 2015-03-18 NOTE — Progress Notes (Signed)
  [facility] :  Will you be returning to the same living situation after discharge:  Yes,  Patient planned to return home At discharge, do you have transportation home?: Yes,  Patient reports access to transportation Do you have the ability to pay for your medications: Yes,  patient will be provided with medication samples and prescriptions  Release of information consent forms completed and in the chart;  Patient's signature needed at discharge.  Patient to Follow up at: Follow-up Information    [facility]   Specialty:  Behavioral Health   Why:  Please go to [facility]'s walk in clinic on Friday, Mar 21, 2015 or any weekday betwen 8AM- 3PM for medication managaement.   Contact information:   [address] [place] [address] [phone]       [facility]   Why:  You are scheduled for an intake appointment on Wednesday, Mar 19, 2015 at 3 PM   Contact information:   [address], [address], [place], [place] [address] Phone: [phone] Fax:  [phone]      Patient denies SI/HI: Yes,  denies    Safety Planning and Suicide Prevention discussed: Yes,  with patient and wife     Has patient been referred to the Quitline?: Patient refused referral  Lacey Wallman, West CarboKristin L 03/18/2015, 1:19 PM
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
## 2015-03-18 NOTE — [facility] Suicide Risk Assessment (Signed)
[facility] Discharge Suicide Risk Assessment   Demographic Factors:  29 year old male, lives with wife, has one son who lives with mother out of state. Currently employed .  Total Time spent with patient: 30 minutes  Musculoskeletal: Strength & Muscle Tone: within normal limits Gait & Station: normal Patient leans: N/A  Psychiatric Specialty Exam: Physical Exam  ROS  Blood pressure 119/84, pulse 76, temperature 97.5 F (36.4 C), temperature source Oral, resp. rate 18, height  (1.727 m), weight 153 lb (69.4 kg), SpO2 100 %.Body mass index is 23.27 kg/(m^2).  General Appearance: Well Groomed  Eye Contact::  Good  Speech:  Normal Rate409  Volume:  Normal  Mood:  states it is improved, presents euthymic  Affect:  Appropriate and Full Range  Thought Process:  Goal Directed and Linear  Orientation:  Full (Time, Place, and Person)  Thought Content:  denies hallucinations, no delusions , not internally preoccupied   Suicidal Thoughts:  No- at this time denies any thoughts of hurting self or anyone else   Homicidal Thoughts:  No  Memory:  Recent and Remote grossly intact  Judgement:  Other:  improved   Insight:  improving   Psychomotor Activity:  Normal  Concentration:  Good  Recall:  Good  Fund of Knowledge:Good  Language: Good  Akathisia:  Negative  Handed:  Right  AIMS (if indicated):     Assets:  Communication Skills Desire for Improvement Physical Health Resilience  Sleep:  Number of Hours: 6.5  Cognition: WNL  ADL's:  Improved       Has this patient used any form of tobacco in the last 30 days? (Cigarettes, Smokeless Tobacco, Cigars, and/or Pipes) Yes,is interested in getting Nicoderm patches prescribed upon discharge .  Mental Status Per Nursing Assessment::   On Admission:     Current Mental Status by Physician: At this time patient is fully alert and attentive, well groomed, good eye contact, speech normal, mood improved , seems euthymic, affect appropriate,  reactive, no psychomotor restlessness, no SI or HI, no psychotic symptoms, future oriented .   Loss Factors: Marital discord  Historical Factors: History of angry, explosive outbursts, history of cannabis dependence, history of prior psychiatric admission at [facility] .  Risk Reduction Factors:   Sense of responsibility to family, Employed, Living with another person, especially a relative and Positive coping skills or problem solving skills  Continued Clinical Symptoms:  At this time patient is calm, euthymic, full range of affect, and denying any suicidal or homicidal ideations, specifically denying any thoughts of hurting wife or anyone else .  Of note, I have spoken with wife- Victorino Dike- on the phone. She is aware of his history of developing HI towards her ( now completely resolved) . She stated that he has tended to improve over the last several months, and has been better able to control his anger  And avoid become violent. She reported  He was better and was " OK with him being discharged ".   Cognitive Features That Contribute To Risk:  No gross cognitive deficits noted upon discharge. Is alert , attentive, and oriented x 3   Suicide Risk:  Mild:  Suicidal ideation of limited frequency, intensity, duration, and specificity.  There are no identifiable plans, no associated intent, mild dysphoria and related symptoms, good self-control (both objective and subjective assessment), few other risk factors, and identifiable protective factors, including available and accessible social support.  Principal Problem: Anxiety disorder Discharge Diagnoses:  Patient Active Problem List  Diagnosis Date Noted  . Bipolar disorder [F31.9] 03/15/2015  . Anxiety disorder [F41.9] 03/14/2015    Follow-up Information    [facility]   Specialty:  Behavioral Health   Why:  Please go to [facility] walk in clinic on Friday, Mar 21, 2015 or any weekday betwen 8AM- 3PM for medication managaement.   Contact  information:   [address] [place] [address] [phone]       [facility] On 03/19/2015.   Why:  You are scheduled for an intake appointment on Wednesday, Mar 19, 2015 at 3 PM   Contact information:   [address], [address], [place], [place] [address] Phone: [phone] Fax:  [phone]      Plan Of Care/Follow-up recommendations:  Activity:  as tolerated Diet:  Regular Tests:  NA Other:  See below  Is patient on multiple antipsychotic therapies at discharge:  No   Has Patient had three or more failed trials of antipsychotic monotherapy by history:  No  Recommended Plan for Multiple Antipsychotic Therapies: NA   Patient is leaving unit in good spirits . He is planning on returning home and plans to follow up as above . Encouraged patient to maintain full sobriety/ avoid drug abuse .     COBOS, FERNANDO 03/18/2015, 12:09 PM

## 2015-03-18 NOTE — Progress Notes (Signed)
D/C instructions/meds/follow-up appointments reviewed, pt verbalized understanding, pt's belongings returned to pt, samples given, denies SI/HI/AVH 
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
## 2015-03-26 ENCOUNTER — Emergency Department (HOSPITAL_COMMUNITY): Payer: Self-pay

## 2015-03-26 ENCOUNTER — Emergency Department (HOSPITAL_COMMUNITY)
Admission: EM | Admit: 2015-03-26 | Discharge: 2015-03-26 | Disposition: A | Discharge status: Home / Self Care | Payer: Self-pay | Attending: Emergency Medicine | Admitting: Emergency Medicine

## 2015-03-26 ENCOUNTER — Encounter (HOSPITAL_COMMUNITY): Payer: Self-pay | Admitting: *Deleted

## 2015-03-26 DIAGNOSIS — Z720 Tobacco use: Secondary | ICD-10-CM | POA: Insufficient documentation

## 2015-03-26 DIAGNOSIS — S62521A Displaced fracture of distal phalanx of right thumb, initial encounter for closed fracture: Secondary | ICD-10-CM | POA: Insufficient documentation

## 2015-03-26 DIAGNOSIS — Y998 Other external cause status: Secondary | ICD-10-CM | POA: Insufficient documentation

## 2015-03-26 DIAGNOSIS — F419 Anxiety disorder, unspecified: Secondary | ICD-10-CM | POA: Insufficient documentation

## 2015-03-26 DIAGNOSIS — T07XXXA Unspecified multiple injuries, initial encounter: Secondary | ICD-10-CM

## 2015-03-26 DIAGNOSIS — Z79899 Other long term (current) drug therapy: Secondary | ICD-10-CM | POA: Insufficient documentation

## 2015-03-26 DIAGNOSIS — S30811A Abrasion of abdominal wall, initial encounter: Secondary | ICD-10-CM | POA: Insufficient documentation

## 2015-03-26 DIAGNOSIS — Y92410 Unspecified street and highway as the place of occurrence of the external cause: Secondary | ICD-10-CM | POA: Insufficient documentation

## 2015-03-26 DIAGNOSIS — Y9389 Activity, other specified: Secondary | ICD-10-CM | POA: Insufficient documentation

## 2015-03-26 DIAGNOSIS — Z8719 Personal history of other diseases of the digestive system: Secondary | ICD-10-CM | POA: Insufficient documentation

## 2015-03-26 DIAGNOSIS — S60511A Abrasion of right hand, initial encounter: Secondary | ICD-10-CM | POA: Insufficient documentation

## 2015-03-26 DIAGNOSIS — S62501A Fracture of unspecified phalanx of right thumb, initial encounter for closed fracture: Secondary | ICD-10-CM

## 2015-03-26 DIAGNOSIS — Z23 Encounter for immunization: Secondary | ICD-10-CM | POA: Insufficient documentation

## 2015-03-26 LAB — CBC WITH DIFFERENTIAL/PLATELET
Basophils Absolute: 0 10*3/uL (ref 0.0–0.1)
Basophils Relative: 0 % (ref 0–1)
EOS ABS: 0 10*3/uL (ref 0.0–0.7)
Eosinophils Relative: 0 % (ref 0–5)
HCT: 39 % (ref 39.0–52.0)
HEMOGLOBIN: 13.4 g/dL (ref 13.0–17.0)
LYMPHS ABS: 2 10*3/uL (ref 0.7–4.0)
Lymphocytes Relative: 19 % (ref 12–46)
MCH: 32.5 pg (ref 26.0–34.0)
MCHC: 34.4 g/dL (ref 30.0–36.0)
MCV: 94.7 fL (ref 78.0–100.0)
MONO ABS: 0.8 10*3/uL (ref 0.1–1.0)
Monocytes Relative: 8 % (ref 3–12)
NEUTROS ABS: 7.6 10*3/uL (ref 1.7–7.7)
NEUTROS PCT: 73 % (ref 43–77)
PLATELETS: 218 10*3/uL (ref 150–400)
RBC: 4.12 MIL/uL — AB (ref 4.22–5.81)
RDW: 13.5 % (ref 11.5–15.5)
WBC: 10.5 10*3/uL (ref 4.0–10.5)

## 2015-03-26 LAB — BASIC METABOLIC PANEL
Anion gap: 13 (ref 5–15)
BUN: 13 mg/dL (ref 6–20)
CALCIUM: 8.4 mg/dL — AB (ref 8.9–10.3)
CO2: 19 mmol/L — AB (ref 22–32)
CREATININE: 1.17 mg/dL (ref 0.61–1.24)
Chloride: 106 mmol/L (ref 101–111)
GFR calc Af Amer: >60 mL/min (ref >60)
GLUCOSE: 83 mg/dL (ref 65–99)
Potassium: 3.4 mmol/L — ABNORMAL LOW (ref 3.5–5.1)
Sodium: 138 mmol/L (ref 135–145)

## 2015-03-26 MED ORDER — ONDANSETRON HCL 4 MG/2ML IJ SOLN
4.0000 mg | Freq: Once | INTRAMUSCULAR | Status: AC
  Administered 2015-03-26: 4 mg via INTRAVENOUS
  Filled 2015-03-26: qty 2

## 2015-03-26 MED ORDER — TETANUS-DIPHTH-ACELL PERTUSSIS 5-2.5-18.5 LF-MCG/0.5 IM SUSP
0.5000 mL | Freq: Once | INTRAMUSCULAR | Status: AC
  Administered 2015-03-26: 0.5 mL via INTRAMUSCULAR
  Filled 2015-03-26: qty 0.5

## 2015-03-26 MED ORDER — FENTANYL CITRATE (PF) 100 MCG/2ML IJ SOLN
50.0000 ug | Freq: Once | INTRAMUSCULAR | Status: AC
  Administered 2015-03-26: 50 ug via INTRAVENOUS
  Filled 2015-03-26: qty 2

## 2015-03-26 MED ORDER — SODIUM CHLORIDE 0.9 % IV BOLUS (SEPSIS)
1000.0000 mL | Freq: Once | INTRAVENOUS | Status: AC
  Administered 2015-03-26: 1000 mL via INTRAVENOUS

## 2015-03-26 MED ORDER — SODIUM CHLORIDE 0.9 % IV SOLN
INTRAVENOUS | Status: DC
  Administered 2015-03-26: 23:00:00 via INTRAVENOUS

## 2015-03-26 MED ORDER — IOHEXOL 300 MG/ML  SOLN
100.0000 mL | Freq: Once | INTRAMUSCULAR | Status: AC | PRN
  Administered 2015-03-26: 100 mL via INTRAVENOUS

## 2015-03-26 MED ORDER — HYDROCODONE-ACETAMINOPHEN 5-325 MG PO TABS: 1.0000 | ORAL_TABLET | Freq: Four times a day (QID) | ORAL | Status: DC | PRN

## 2015-03-26 MED ORDER — OXYCODONE-ACETAMINOPHEN 5-325 MG PO TABS
1.0000 | ORAL_TABLET | Freq: Once | ORAL | Status: AC
  Administered 2015-03-26: 1 via ORAL
  Filled 2015-03-26: qty 1
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
## 2015-03-26 NOTE — ED Notes (Signed)
Spoke with Dr Deretha EmoryZackowski regarding pt - not implementing a trauma level on pt.

## 2015-03-26 NOTE — Discharge Instructions (Signed)
Cast or Splint Care Casts and splints support injured limbs and keep bones from moving while they heal.  HOME CARE  Keep the cast or splint uncovered during the drying period.  A plaster cast can take 24 to 48 hours to dry.  A fiberglass cast will dry in less than 1 hour.  Do not rest the cast on anything harder than a pillow for 24 hours.  Do not put weight on your injured limb. Do not put pressure on the cast. Wait for your doctor's approval.  Keep the cast or splint dry.  Cover the cast or splint with a plastic bag during baths or wet weather.  If you have a cast over your chest and belly (trunk), take sponge baths until the cast is taken off.  If your cast gets wet, dry it with a towel or blow dryer. Use the cool setting on the blow dryer.  Keep your cast or splint clean. Wash a dirty cast with a damp cloth.  Do not put any objects under your cast or splint.  Do not scratch the skin under the cast with an object. If itching is a problem, use a blow dryer on a cool setting over the itchy area.  Do not trim or cut your cast.  Do not take out the padding from inside your cast.  Exercise your joints near the cast as told by your doctor.  Raise (elevate) your injured limb on 1 or 2 pillows for the first 1 to 3 days. GET HELP IF:  Your cast or splint cracks.  Your cast or splint is too tight or too loose.  You itch badly under the cast.  Your cast gets wet or has a soft spot.  You have a bad smell coming from the cast.  You get an object stuck under the cast.  Your skin around the cast becomes red or sore.  You have new or more pain after the cast is put on. GET HELP RIGHT AWAY IF:  You have fluid leaking through the cast.  You cannot move your fingers or toes.  Your fingers or toes turn blue or white or are cool, painful, or puffy (swollen).  You have tingling or lose feeling (numbness) around the injured area.  You have bad pain or pressure under the  cast.  You have trouble breathing or have shortness of breath.  You have chest pain. Document Released: 02/10/2011 Document Revised: 06/13/2013 Document Reviewed: 04/19/2013 [facility] Patient Information 2015 [place]. This information is not intended to replace advice given to you by your health care provider. Make sure you discuss any questions you have with your health care provider.  Keep the splint in place on your right hand thumb. Limited to follow-up with the hand surgery. Expect to be very sore and stiff over the next several days. CT of head neck chest abdomen pelvis without any significant findings. Local wound care to the abrasions wash with soap and water and apply Neosporin type ointment to him. Return for any new or worse symptoms.
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
## 2015-03-26 NOTE — ED Notes (Signed)
Pt stable, ambulatory, states understanding of discharge instructions 

## 2015-03-30 ENCOUNTER — Encounter (HOSPITAL_COMMUNITY): Payer: Self-pay | Admitting: Emergency Medicine

## 2015-03-30 ENCOUNTER — Emergency Department (HOSPITAL_COMMUNITY)
Admission: EM | Admit: 2015-03-30 | Discharge: 2015-03-31 | Disposition: A | Discharge status: Home / Self Care | Payer: Self-pay | Attending: Emergency Medicine | Admitting: Emergency Medicine

## 2015-03-30 ENCOUNTER — Emergency Department (HOSPITAL_COMMUNITY): Payer: Self-pay

## 2015-03-30 DIAGNOSIS — T50901A Poisoning by unspecified drugs, medicaments and biological substances, accidental (unintentional), initial encounter: Secondary | ICD-10-CM | POA: Diagnosis present

## 2015-03-30 DIAGNOSIS — F419 Anxiety disorder, unspecified: Secondary | ICD-10-CM | POA: Insufficient documentation

## 2015-03-30 DIAGNOSIS — F314 Bipolar disorder, current episode depressed, severe, without psychotic features: Secondary | ICD-10-CM | POA: Diagnosis present

## 2015-03-30 DIAGNOSIS — R45851 Suicidal ideations: Secondary | ICD-10-CM

## 2015-03-30 DIAGNOSIS — Z8719 Personal history of other diseases of the digestive system: Secondary | ICD-10-CM | POA: Insufficient documentation

## 2015-03-30 DIAGNOSIS — Z720 Tobacco use: Secondary | ICD-10-CM | POA: Insufficient documentation

## 2015-03-30 DIAGNOSIS — T43212A Poisoning by selective serotonin and norepinephrine reuptake inhibitors, intentional self-harm, initial encounter: Secondary | ICD-10-CM | POA: Insufficient documentation

## 2015-03-30 DIAGNOSIS — F1210 Cannabis abuse, uncomplicated: Secondary | ICD-10-CM | POA: Insufficient documentation

## 2015-03-30 DIAGNOSIS — Z79899 Other long term (current) drug therapy: Secondary | ICD-10-CM | POA: Insufficient documentation

## 2015-03-30 DIAGNOSIS — T1491 Suicide attempt: Secondary | ICD-10-CM

## 2015-03-30 LAB — COMPREHENSIVE METABOLIC PANEL
ALT: 10 U/L — ABNORMAL LOW (ref 17–63)
ANION GAP: 9 (ref 5–15)
AST: 13 U/L — AB (ref 15–41)
Albumin: 3.5 g/dL (ref 3.5–5.0)
Alkaline Phosphatase: 55 U/L (ref 38–126)
BUN: 14 mg/dL (ref 6–20)
CO2: 22 mmol/L (ref 22–32)
CREATININE: 0.9 mg/dL (ref 0.61–1.24)
Calcium: 8.3 mg/dL — ABNORMAL LOW (ref 8.9–10.3)
Chloride: 107 mmol/L (ref 101–111)
GFR calc non Af Amer: >60 mL/min (ref >60)
Glucose, Bld: 106 mg/dL — ABNORMAL HIGH (ref 65–99)
Potassium: 3 mmol/L — ABNORMAL LOW (ref 3.5–5.1)
SODIUM: 138 mmol/L (ref 135–145)
Total Bilirubin: 0.1 mg/dL — ABNORMAL LOW (ref 0.3–1.2)
Total Protein: 6.1 g/dL — ABNORMAL LOW (ref 6.5–8.1)

## 2015-03-30 LAB — RAPID URINE DRUG SCREEN, HOSP PERFORMED
AMPHETAMINES: NOT DETECTED
BARBITURATES: NOT DETECTED
BENZODIAZEPINES: NOT DETECTED
Cocaine: NOT DETECTED
Opiates: NOT DETECTED
Tetrahydrocannabinol: POSITIVE — AB

## 2015-03-30 LAB — ETHANOL

## 2015-03-30 LAB — SALICYLATE LEVEL

## 2015-03-30 LAB — CBG MONITORING, ED: Glucose-Capillary: 100 mg/dL — ABNORMAL HIGH (ref 65–99)

## 2015-03-30 LAB — ACETAMINOPHEN LEVEL: Acetaminophen (Tylenol), Serum: <10 ug/mL — ABNORMAL LOW (ref 10–30)

## 2015-03-30 MED ORDER — ONDANSETRON HCL 4 MG/2ML IJ SOLN
4.0000 mg | Freq: Once | INTRAMUSCULAR | Status: AC
  Administered 2015-03-30: 4 mg via INTRAVENOUS
  Filled 2015-03-30: qty 2

## 2015-03-30 MED ORDER — POTASSIUM CHLORIDE CRYS ER 20 MEQ PO TBCR
60.0000 meq | EXTENDED_RELEASE_TABLET | Freq: Once | ORAL | Status: AC
  Administered 2015-03-30: 60 meq via ORAL
  Filled 2015-03-30: qty 3

## 2015-03-30 MED ORDER — TRAZODONE HCL 50 MG PO TABS: 150.0000 mg | ORAL_TABLET | Freq: Every day | ORAL | Status: DC

## 2015-03-30 MED ORDER — ACETAMINOPHEN 500 MG PO TABS
1000.0000 mg | ORAL_TABLET | Freq: Three times a day (TID) | ORAL | Status: DC | PRN
  Administered 2015-03-30 – 2015-03-31 (×3): 1000 mg via ORAL
  Filled 2015-03-30 (×3): qty 2

## 2015-03-30 MED ORDER — BUSPIRONE HCL 10 MG PO TABS: 15.0000 mg | ORAL_TABLET | Freq: Two times a day (BID) | ORAL | Status: DC

## 2015-03-30 MED ORDER — DIVALPROEX SODIUM 500 MG PO DR TAB
500.0000 mg | DELAYED_RELEASE_TABLET | Freq: Two times a day (BID) | ORAL | Status: DC
  Administered 2015-03-31: 500 mg via ORAL
  Filled 2015-03-30: qty 1

## 2015-03-30 MED ORDER — CITALOPRAM HYDROBROMIDE 40 MG PO TABS: 40.0000 mg | ORAL_TABLET | Freq: Every day | ORAL | Status: DC

## 2015-03-30 MED ORDER — DIVALPROEX SODIUM 500 MG PO DR TAB: 500.0000 mg | DELAYED_RELEASE_TABLET | Freq: Two times a day (BID) | ORAL | Status: DC

## 2015-03-30 MED ORDER — BUSPIRONE HCL 10 MG PO TABS
15.0000 mg | ORAL_TABLET | Freq: Two times a day (BID) | ORAL | Status: DC
  Administered 2015-03-31: 15 mg via ORAL
  Filled 2015-03-30: qty 2

## 2015-03-30 MED ORDER — GABAPENTIN 300 MG PO CAPS
300.0000 mg | ORAL_CAPSULE | Freq: Three times a day (TID) | ORAL | Status: DC
  Administered 2015-03-31: 300 mg via ORAL
  Filled 2015-03-30: qty 1

## 2015-03-30 MED ORDER — HYDROXYZINE HCL 25 MG PO TABS: 50.0000 mg | ORAL_TABLET | Freq: Four times a day (QID) | ORAL | Status: DC | PRN

## 2015-03-30 MED ORDER — CITALOPRAM HYDROBROMIDE 40 MG PO TABS
40.0000 mg | ORAL_TABLET | Freq: Every day | ORAL | Status: DC
  Administered 2015-03-31: 40 mg via ORAL
  Filled 2015-03-30 (×2): qty 1

## 2015-03-30 MED ORDER — SODIUM CHLORIDE 0.9 % IV BOLUS (SEPSIS)
1000.0000 mL | Freq: Once | INTRAVENOUS | Status: AC
  Administered 2015-03-30: 1000 mL via INTRAVENOUS

## 2015-03-30 MED ORDER — HYDROXYZINE HCL 25 MG PO TABS: 50.0000 mg | ORAL_TABLET | ORAL | Status: DC | PRN

## 2015-03-30 MED ORDER — GABAPENTIN 300 MG PO CAPS: 300.0000 mg | ORAL_CAPSULE | Freq: Three times a day (TID) | ORAL | Status: DC

## 2015-03-30 NOTE — BH Assessment (Signed)
Per Binnie RailJoann Glover, [facility] adult unit is currently at [facility]. Contacted the following facilities for placement:  BED AVAILABLE, PT IS UNDER REVIEW: [facility], [facility]  AT CAPACITY: [facility], per [facility], [facility], per [facility], per [facility], per [facility], per [facility], per [facility], per [facility], per [employer] [facility], per [facility], per [facility], per [facility], per [facility] [place], per Kennieth RadSheila [facility]nn [facility], per [facility], per Janeal Holmes[facility], per [facility], per [facility] [place], per Jonah   Pamalee LeydenFord Ellis Bohdi Leeds Jr, [facility], [facility], [facility] Triage Specialist [phone]

## 2015-03-30 NOTE — Progress Notes (Signed)
Disposition CSW completed referrals for this patient to the following inpatient psych facilities:  [facility]  CSW will continue to assist with patient's disposition needs.  Seward SpeckLeo Grafton Warzecha [facility] CenterCSW,LCAS Behavioral Health Disposition CSW [phone]
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
## 2015-03-30 NOTE — ED Notes (Signed)
Acuity low Pt is resting with eyes closed and regular/even respirations.

## 2015-03-30 NOTE — BH Assessment (Signed)
Received notification of TTS consult request. Spoke to Timothy CrumbleAdeleke Oni, MD who said Pt overdosed in a suicide attempt. Tele-assessment will be initiated.  Harlin RainFord Ellis Patsy BaltimoreWarrick Jr, LPC, [facility], [facility] Triage Specialist [phone]

## 2015-03-30 NOTE — ED Notes (Signed)
Acuity note: Timothy DubinFair Pearson has thus far been calm and cooperative.
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
## 2015-03-30 NOTE — ED Notes (Signed)
Patient is up ambulating by himself.

## 2015-03-30 NOTE — ED Notes (Signed)
Report called to Cly[facility]Karen RN in [facility], pt changed into scrubs and wanded.

## 2015-03-30 NOTE — ED Notes (Signed)
Per EMS pt took allegedly 15 trazadone 100mg  tabs around 0030  Pt has had vomiting since the ingestion  Pt fell off his front porch while vomiting  Pt has admitted that this was a suicide attempt but then in route told EMS he did not want to die  Pt is alert and oriented  Pt vomited approximately 500 ml with ems  IV was established by EMS

## 2015-03-30 NOTE — Progress Notes (Signed)
Hyman HopesGarl has been calm and cooperative [facility]. He denies SI/HI/AVH, but admitted pain in his right hand. PRN given, and pt is now resting quietly, with eyes closed and regular/even respirations. Will continue to monitor for needs/safety.

## 2015-03-30 NOTE — ED Notes (Signed)
David from poison control called to get an update on patient. Patient was sleep but vitals are stable.

## 2015-03-31 MED ORDER — BACITRACIN 500 UNIT/GM EX OINT
TOPICAL_OINTMENT | Freq: Two times a day (BID) | CUTANEOUS | Status: DC
  Administered 2015-03-31: 1 via TOPICAL
  Filled 2015-03-31: qty 0.9

## 2015-03-31 NOTE — ED Notes (Signed)
Patient discharged to home.  All belongings returned.  Denies thoughts of harm to self or others.  Left the unit ambulatory with bus pass.

## 2015-03-31 NOTE — Progress Notes (Signed)
CM spoke with pt who [place].  CM discussed and provided written information for self pay pcps, discussed the importance of pcp vs EDP services for f/u care, www.needymeds.org, www.goodrx.com, discounted pharmacies and other [place] resources such as [facility] , [facility], affordable care act,  [place] med assist, financial assistance, self pay dental services, [place] med assist, DSS and  health department  Reviewed resources for [place] self pay pcps like [facility], [facility] medicine [facility] [address], [facility], [facility], local urgent care centers, [facility], [facility], general medical clinics, [facility], [facility] plus others, medication resources, [facility] out patient pharmacies and housing Pt voiced understanding and appreciation of resources provided   Provided [facility] contact information Pt agreed to a referral  Pt to be contact by [facility] clinical liaison

## 2015-03-31 NOTE — BH Assessment (Signed)
[facility] Assessment Progress Note  Per Thedore MinsMojeed Akintayo, MD this pt does not require psychiatric hospitalization at this time.  [facility]  [facility] information for [facility] has been included in pt's discharge instructions.  Pt has ongoing medical problems, but does not have an outpatient provider.  Selena BattenKim, the nurse case manager, agrees to speak with him.  Pt's nurse has been notified.  Doylene Canninghomas Caprisha Bridgett, MA Triage Specialist [phone]

## 2015-03-31 NOTE — Discharge Instructions (Signed)
For your ongoing mental health needs, you are advised to follow up with [facility].  New and returning patients are seen at their walk-in clinic.  Walk-in hours are Monday - Friday from 8:00 am - 3:00 pm.  Walk-in patients are seen on a first come, first served basis.  Try to arrive as early as possible for he best chance of being seen the same day: ° °     [facility] °     [address] °     [place], [place] [address] °     [phone] °
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
## 2015-12-31 ENCOUNTER — Encounter (HOSPITAL_COMMUNITY): Payer: Self-pay | Admitting: *Deleted

## 2015-12-31 ENCOUNTER — Emergency Department (HOSPITAL_COMMUNITY)
Admission: EM | Admit: 2015-12-31 | Discharge: 2015-12-31 | Disposition: A | Discharge status: Home / Self Care | Payer: Self-pay | Attending: Emergency Medicine | Admitting: Emergency Medicine

## 2015-12-31 ENCOUNTER — Emergency Department (HOSPITAL_COMMUNITY): Payer: Self-pay

## 2015-12-31 DIAGNOSIS — Y9389 Activity, other specified: Secondary | ICD-10-CM | POA: Insufficient documentation

## 2015-12-31 DIAGNOSIS — F17210 Nicotine dependence, cigarettes, uncomplicated: Secondary | ICD-10-CM | POA: Insufficient documentation

## 2015-12-31 DIAGNOSIS — Z8719 Personal history of other diseases of the digestive system: Secondary | ICD-10-CM | POA: Insufficient documentation

## 2015-12-31 DIAGNOSIS — Y998 Other external cause status: Secondary | ICD-10-CM | POA: Insufficient documentation

## 2015-12-31 DIAGNOSIS — S29001A Unspecified injury of muscle and tendon of front wall of thorax, initial encounter: Secondary | ICD-10-CM | POA: Insufficient documentation

## 2015-12-31 DIAGNOSIS — Y92410 Unspecified street and highway as the place of occurrence of the external cause: Secondary | ICD-10-CM | POA: Insufficient documentation

## 2015-12-31 DIAGNOSIS — R0781 Pleurodynia: Secondary | ICD-10-CM

## 2015-12-31 DIAGNOSIS — Z79899 Other long term (current) drug therapy: Secondary | ICD-10-CM | POA: Insufficient documentation
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
## 2015-12-31 NOTE — Discharge Instructions (Signed)
May take tylenol or motrin as needed for pain. Use incentive spirometer as demonstrated here in ED. Follow-up with your primary care doctor. Return to the ED for new or worsening symptoms.

## 2016-01-15 IMAGING — CR DG WRIST COMPLETE 3+V*R*
4 series · 4 of 4 positions shown · non-contrast
Comparison: Right hand radiographs 03/26/2015

CLINICAL DATA: Right wrist pain after fall.

EXAM:
RIGHT WRIST - COMPLETE 3+ VIEW

[x wrist pa right]
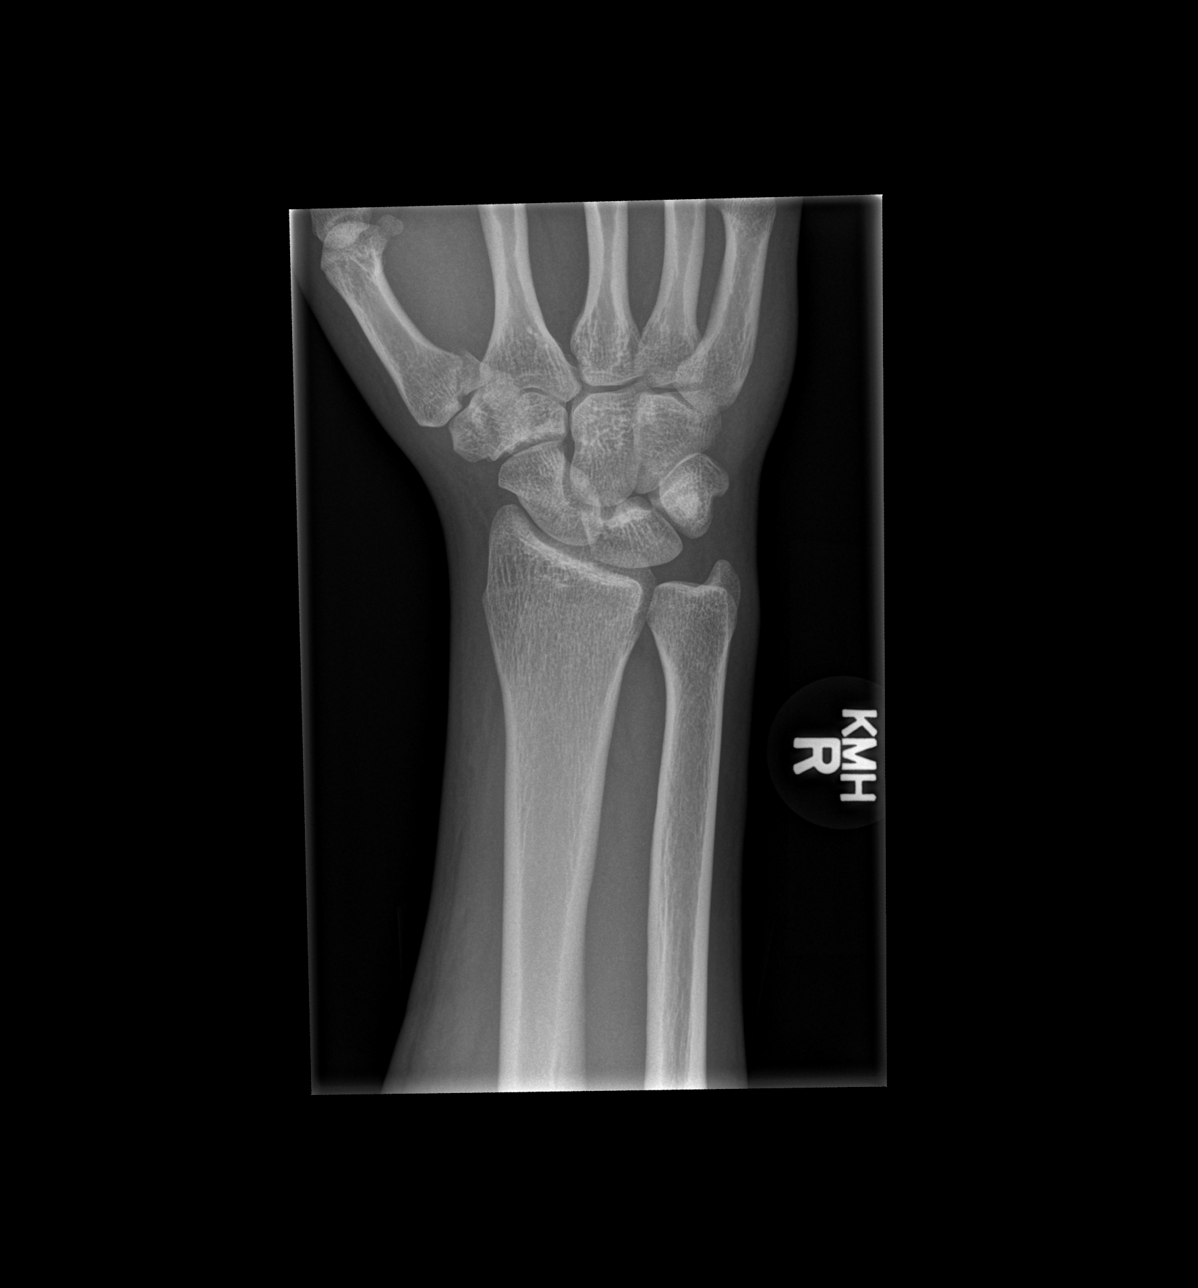

[x wrist obl right]
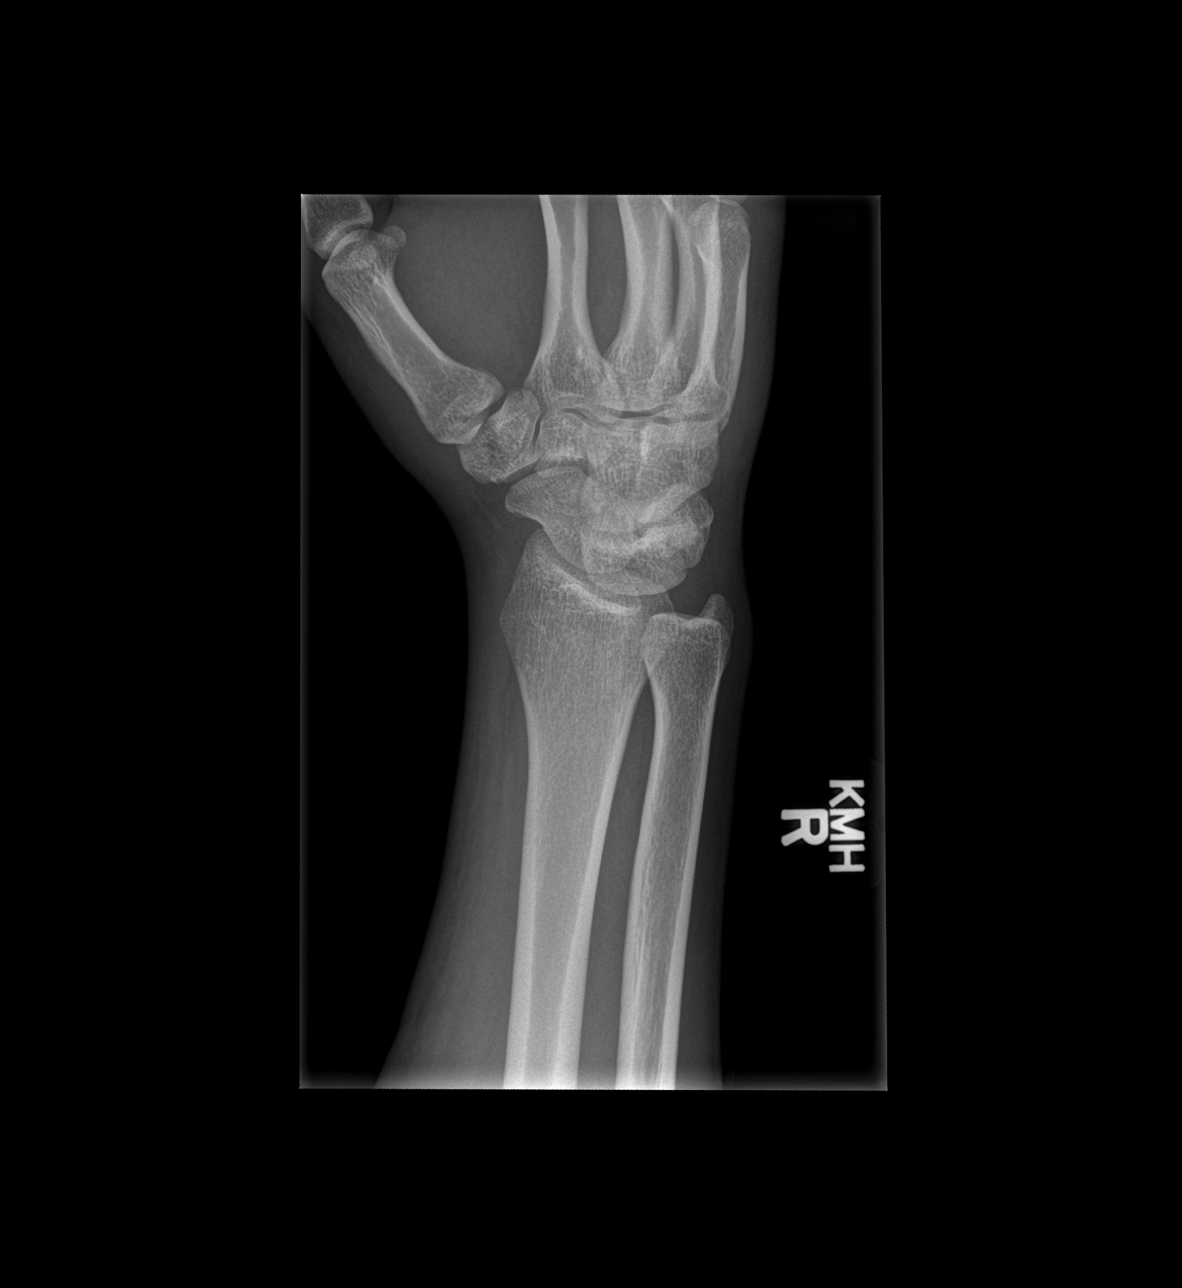

[x wrist lat right]
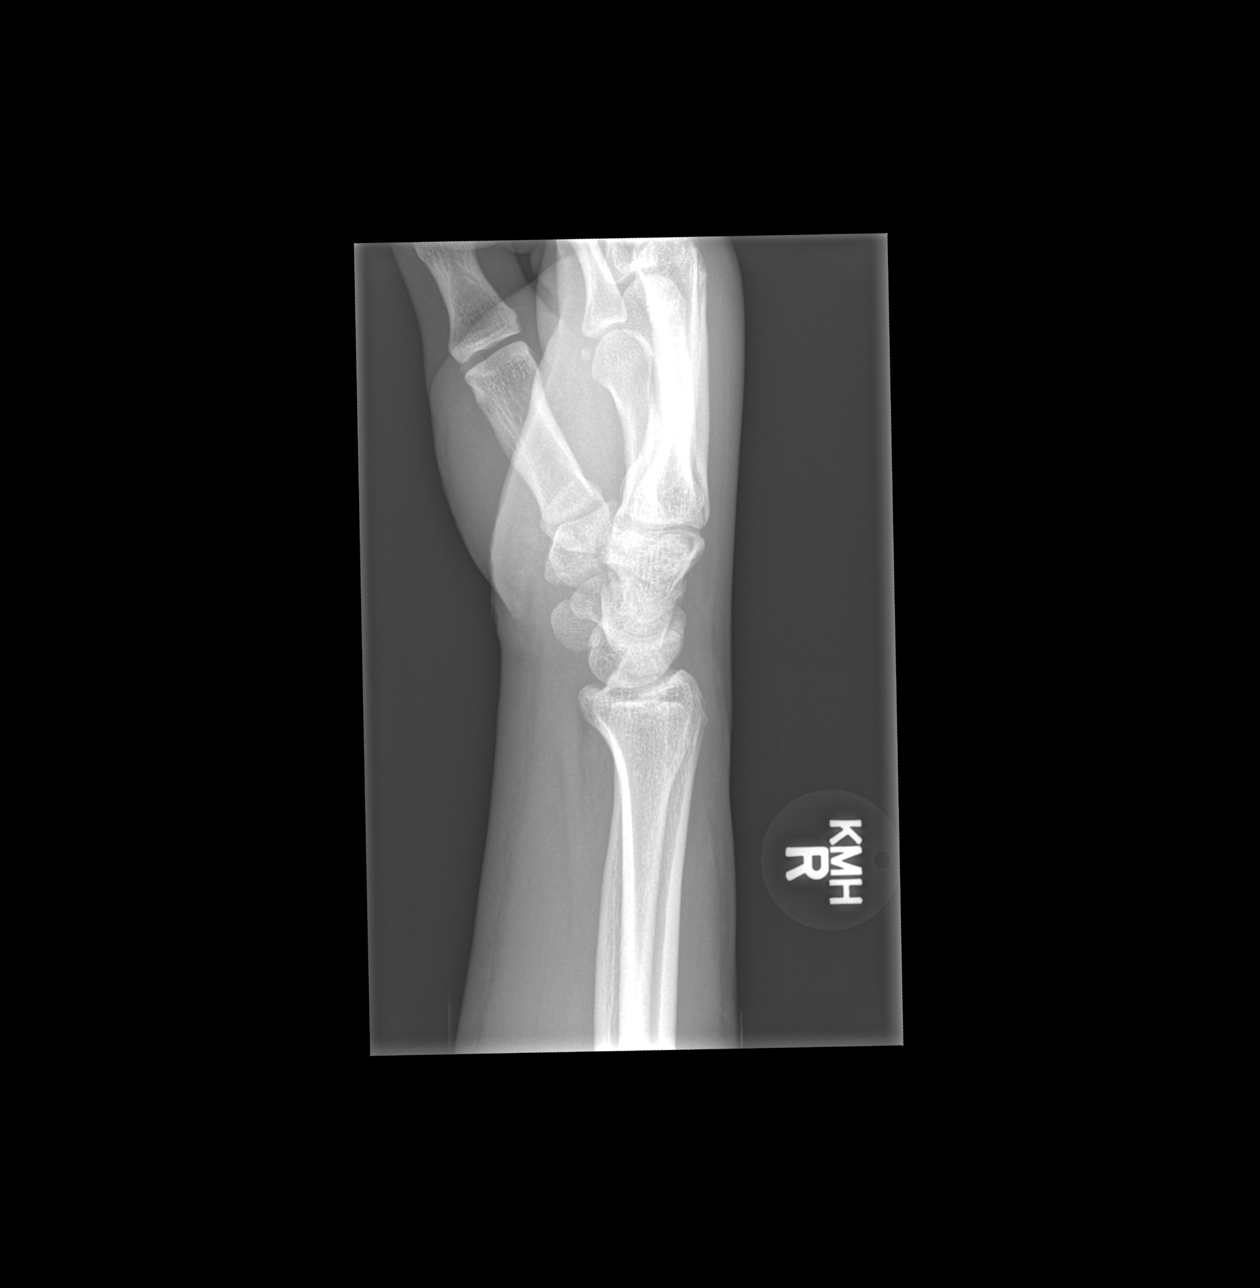

[x wrist navicular view right]
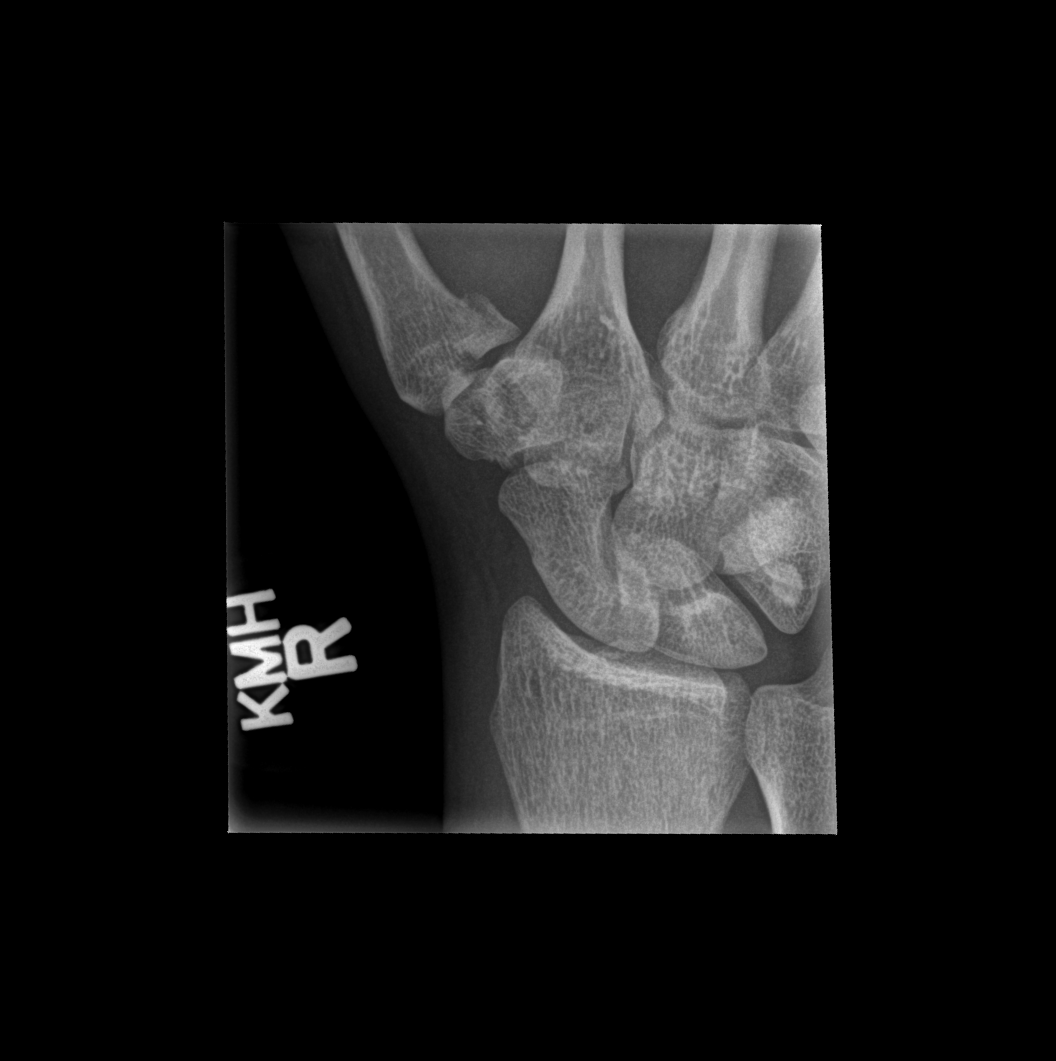

[4 of 4 positions shown; findings below may reference images not displayed]

FINDINGS: The mildly displaced intra-articular fracture at the base of the
thumb metacarpal is unchanged in alignment from prior exam. No acute
fracture from prior exam. The alignment is unchanged.
IMPRESSION: Unchanged mildly displaced fracture base of the thumb metacarpal. No
additional acute fracture.

## 2016-07-29 ENCOUNTER — Encounter: Payer: Self-pay | Admitting: Medical Oncology

## 2016-07-29 ENCOUNTER — Inpatient Hospital Stay: Payer: Self-pay | Admitting: Anesthesiology

## 2016-07-29 ENCOUNTER — Emergency Department: Payer: Self-pay

## 2016-07-29 ENCOUNTER — Encounter
Admission: EM | Disposition: A | Discharge status: Home / Self Care | Payer: Self-pay | Source: Home / Self Care | Attending: Emergency Medicine

## 2016-07-29 ENCOUNTER — Observation Stay
Admission: EM | Admit: 2016-07-29 | Discharge: 2016-07-30 | Disposition: A | Discharge status: Home / Self Care | Payer: Self-pay | Attending: Surgery | Admitting: Surgery

## 2016-07-29 DIAGNOSIS — K353 Acute appendicitis with localized peritonitis, without perforation or gangrene: Secondary | ICD-10-CM | POA: Insufficient documentation

## 2016-07-29 DIAGNOSIS — K449 Diaphragmatic hernia without obstruction or gangrene: Secondary | ICD-10-CM | POA: Insufficient documentation

## 2016-07-29 DIAGNOSIS — K3580 Unspecified acute appendicitis: Principal | ICD-10-CM

## 2016-07-29 DIAGNOSIS — F419 Anxiety disorder, unspecified: Secondary | ICD-10-CM | POA: Insufficient documentation

## 2016-07-29 DIAGNOSIS — Z23 Encounter for immunization: Secondary | ICD-10-CM | POA: Insufficient documentation

## 2016-07-29 DIAGNOSIS — Z886 Allergy status to analgesic agent status: Secondary | ICD-10-CM | POA: Insufficient documentation

## 2016-07-29 DIAGNOSIS — F17210 Nicotine dependence, cigarettes, uncomplicated: Secondary | ICD-10-CM | POA: Insufficient documentation

## 2016-07-29 HISTORY — PX: LAPAROSCOPIC APPENDECTOMY: SHX408

## 2016-07-29 LAB — CBC
HEMATOCRIT: 45 % (ref 40.0–52.0)
Hemoglobin: 15.5 g/dL (ref 13.0–18.0)
MCH: 33.4 pg (ref 26.0–34.0)
MCHC: 34.6 g/dL (ref 32.0–36.0)
MCV: 96.8 fL (ref 80.0–100.0)
PLATELETS: 255 10*3/uL (ref 150–440)
RBC: 4.64 MIL/uL (ref 4.40–5.90)
RDW: 12.9 % (ref 11.5–14.5)
WBC: 15.6 10*3/uL — AB (ref 3.8–10.6)

## 2016-07-29 LAB — COMPREHENSIVE METABOLIC PANEL
ALK PHOS: 51 U/L (ref 38–126)
ALT: 23 U/L (ref 17–63)
ANION GAP: 12 (ref 5–15)
AST: 24 U/L (ref 15–41)
Albumin: 4.7 g/dL (ref 3.5–5.0)
BILIRUBIN TOTAL: 0.9 mg/dL (ref 0.3–1.2)
BUN: 19 mg/dL (ref 6–20)
CO2: 20 mmol/L — ABNORMAL LOW (ref 22–32)
CREATININE: 1.33 mg/dL — AB (ref 0.61–1.24)
Calcium: 9.4 mg/dL (ref 8.9–10.3)
Chloride: 102 mmol/L (ref 101–111)
Glucose, Bld: 103 mg/dL — ABNORMAL HIGH (ref 65–99)
Potassium: 3.4 mmol/L — ABNORMAL LOW (ref 3.5–5.1)
SODIUM: 134 mmol/L — AB (ref 135–145)
TOTAL PROTEIN: 8 g/dL (ref 6.5–8.1)

## 2016-07-29 LAB — URINALYSIS COMPLETE WITH MICROSCOPIC (ARMC ONLY)
BACTERIA UA: NONE SEEN
BILIRUBIN URINE: NEGATIVE
GLUCOSE, UA: NEGATIVE mg/dL
Hgb urine dipstick: NEGATIVE
LEUKOCYTES UA: NEGATIVE
Nitrite: NEGATIVE
Protein, ur: NEGATIVE mg/dL
SQUAMOUS EPITHELIAL / LPF: NONE SEEN
Specific Gravity, Urine: 1.028 (ref 1.005–1.030)
pH: 5 (ref 5.0–8.0)

## 2016-07-29 LAB — LIPASE, BLOOD: Lipase: 19 U/L (ref 11–51)

## 2016-07-29 SURGERY — APPENDECTOMY, LAPAROSCOPIC
Anesthesia: General

## 2016-07-29 MED ORDER — IOPAMIDOL (ISOVUE-300) INJECTION 61%
100.0000 mL | Freq: Once | INTRAVENOUS | Status: AC | PRN
  Administered 2016-07-29: 100 mL via INTRAVENOUS
  Filled 2016-07-29: qty 100

## 2016-07-29 MED ORDER — ONDANSETRON HCL 4 MG/2ML IJ SOLN
4.0000 mg | Freq: Once | INTRAMUSCULAR | Status: AC
Start: 2016-07-29 — End: 2016-07-29
  Administered 2016-07-29: 4 mg via INTRAVENOUS

## 2016-07-29 MED ORDER — LACTATED RINGERS IV SOLN
125.0000 mL/h | INTRAVENOUS | Status: DC
  Administered 2016-07-29: via INTRAVENOUS
  Administered 2016-07-30 (×2): 125 mL/h via INTRAVENOUS

## 2016-07-29 MED ORDER — FAMOTIDINE IN NACL 20-0.9 MG/50ML-% IV SOLN
20.0000 mg | Freq: Two times a day (BID) | INTRAVENOUS | Status: DC
  Administered 2016-07-30: 20 mg via INTRAVENOUS
  Filled 2016-07-29 (×3): qty 50

## 2016-07-29 MED ORDER — MIDAZOLAM HCL 2 MG/2ML IJ SOLN
INTRAMUSCULAR | Status: DC | PRN
  Administered 2016-07-29: 2 mg via INTRAVENOUS

## 2016-07-29 MED ORDER — HYDROMORPHONE HCL 1 MG/ML IJ SOLN
0.5000 mg | INTRAMUSCULAR | Status: DC | PRN
  Administered 2016-07-29 – 2016-07-30 (×3): 0.5 mg via INTRAVENOUS
  Filled 2016-07-29 (×3): qty 1

## 2016-07-29 MED ORDER — ONDANSETRON HCL 4 MG/2ML IJ SOLN
4.0000 mg | Freq: Four times a day (QID) | INTRAMUSCULAR | Status: DC | PRN
  Administered 2016-07-30: 4 mg via INTRAVENOUS

## 2016-07-29 MED ORDER — POLYETHYLENE GLYCOL 3350 17 G PO PACK: 17.0000 g | PACK | Freq: Every day | ORAL | Status: DC | PRN

## 2016-07-29 MED ORDER — SODIUM CHLORIDE 0.9 % IV BOLUS (SEPSIS)
1000.0000 mL | Freq: Once | INTRAVENOUS | Status: AC
  Administered 2016-07-29: 1000 mL via INTRAVENOUS

## 2016-07-29 MED ORDER — NICOTINE 7 MG/24HR TD PT24
7.0000 mg | MEDICATED_PATCH | Freq: Once | TRANSDERMAL | Status: AC
  Administered 2016-07-29: 7 mg via TRANSDERMAL
  Filled 2016-07-29: qty 1

## 2016-07-29 MED ORDER — ONDANSETRON 4 MG PO TBDP: 4.0000 mg | ORAL_TABLET | Freq: Four times a day (QID) | ORAL | Status: DC | PRN

## 2016-07-29 MED ORDER — PNEUMOCOCCAL VAC POLYVALENT 25 MCG/0.5ML IJ INJ
0.5000 mL | INJECTION | INTRAMUSCULAR | Status: AC
  Administered 2016-07-30: 0.5 mL via INTRAMUSCULAR
  Filled 2016-07-29: qty 0.5

## 2016-07-29 MED ORDER — MORPHINE SULFATE (PF) 4 MG/ML IV SOLN
4.0000 mg | Freq: Once | INTRAVENOUS | Status: AC
  Administered 2016-07-29: 4 mg via INTRAVENOUS

## 2016-07-29 MED ORDER — HEPARIN SODIUM (PORCINE) 5000 UNIT/ML IJ SOLN
5000.0000 [IU] | Freq: Three times a day (TID) | INTRAMUSCULAR | Status: DC
  Administered 2016-07-29 – 2016-07-30 (×2): 5000 [IU] via SUBCUTANEOUS
  Filled 2016-07-29 (×2): qty 1

## 2016-07-29 MED ORDER — OXYCODONE HCL 5 MG PO TABS
5.0000 mg | ORAL_TABLET | ORAL | Status: DC | PRN
  Administered 2016-07-30: 5 mg via ORAL
  Administered 2016-07-30 (×2): 10 mg via ORAL
  Filled 2016-07-29: qty 1
  Filled 2016-07-29: qty 2
  Filled 2016-07-29 (×2): qty 1

## 2016-07-29 MED ORDER — DEXTROSE 5 % IV SOLN
2.0000 g | Freq: Once | INTRAVENOUS | Status: AC
  Administered 2016-07-29: 2 g via INTRAVENOUS
  Filled 2016-07-29 (×2): qty 2

## 2016-07-29 MED ORDER — INFLUENZA VAC SPLIT QUAD 0.5 ML IM SUSY
0.5000 mL | PREFILLED_SYRINGE | INTRAMUSCULAR | Status: AC
  Administered 2016-07-30: 0.5 mL via INTRAMUSCULAR
  Filled 2016-07-29: qty 0.5

## 2016-07-29 MED ORDER — ACETAMINOPHEN 500 MG PO TABS
1000.0000 mg | ORAL_TABLET | Freq: Four times a day (QID) | ORAL | Status: DC
  Administered 2016-07-30 (×2): 1000 mg via ORAL
  Filled 2016-07-29 (×2): qty 2

## 2016-07-29 MED ORDER — ONDANSETRON HCL 4 MG/2ML IJ SOLN
INTRAMUSCULAR | Status: AC
  Administered 2016-07-29: 4 mg via INTRAVENOUS
  Filled 2016-07-29: qty 2

## 2016-07-29 MED ORDER — MORPHINE SULFATE (PF) 4 MG/ML IV SOLN
INTRAVENOUS | Status: AC
  Administered 2016-07-29: 4 mg via INTRAVENOUS
  Filled 2016-07-29: qty 1

## 2016-07-29 MED ORDER — IOPAMIDOL (ISOVUE-300) INJECTION 61%
30.0000 mL | Freq: Once | INTRAVENOUS | Status: AC | PRN
  Administered 2016-07-29: 30 mL via ORAL
  Filled 2016-07-29: qty 30

## 2016-07-29 SURGICAL SUPPLY — 50 items
ADH SKN CLS APL DERMABOND .7 (GAUZE/BANDAGES/DRESSINGS) ×1
APPLIER CLIP 5 13 M/L LIGAMAX5 (MISCELLANEOUS)
APR CLP MED LRG 5 ANG JAW (MISCELLANEOUS)
CANISTER SUCT 1200ML W/VALVE (MISCELLANEOUS) ×3 IMPLANT
CHLORAPREP W/TINT 26ML (MISCELLANEOUS) ×3 IMPLANT
CLIP APPLIE 5 13 M/L LIGAMAX5 (MISCELLANEOUS) ×1 IMPLANT
CUTTER FLEX LINEAR 45M (STAPLE) ×3 IMPLANT
DERMABOND ADVANCED (GAUZE/BANDAGES/DRESSINGS) ×2
DERMABOND ADVANCED .7 DNX12 (GAUZE/BANDAGES/DRESSINGS) IMPLANT
ELECT CAUTERY BLADE 6.4 (BLADE) ×3 IMPLANT
ELECT REM PT RETURN 9FT ADLT (ELECTROSURGICAL) ×3
ELECTRODE REM PT RTRN 9FT ADLT (ELECTROSURGICAL) ×1 IMPLANT
ENDOPOUCH RETRIEVER 10 (MISCELLANEOUS) ×3 IMPLANT
GLOVE PROTEXIS LATEX SZ 7.5 (GLOVE) ×6 IMPLANT
GLOVE SURG LATEX 7.5 PF (GLOVE) ×1 IMPLANT
GLOVE SURG SYN 7.0 (GLOVE) ×6 IMPLANT
GLOVE SURG SYN 7.0 PF PI (GLOVE) ×1 IMPLANT
GOWN STRL REUS W/ TWL LRG LVL3 (GOWN DISPOSABLE) ×2 IMPLANT
GOWN STRL REUS W/TWL LRG LVL3 (GOWN DISPOSABLE) ×6
IRRIGATION STRYKERFLOW (MISCELLANEOUS) ×1 IMPLANT
IRRIGATOR STRYKERFLOW (MISCELLANEOUS) ×3
IV NS 1000ML (IV SOLUTION) ×3
IV NS 1000ML BAXH (IV SOLUTION) ×1 IMPLANT
KIT RM TURNOVER STRD PROC AR (KITS) ×3 IMPLANT
L-HOOK LAP DISP 36CM (ELECTROSURGICAL)
LABEL OR SOLS (LABEL) ×1 IMPLANT
LHOOK LAP DISP 36CM (ELECTROSURGICAL) ×1 IMPLANT
LIGASURE MARYLAND LAP STAND (ELECTROSURGICAL) ×2 IMPLANT
LIQUID BAND (GAUZE/BANDAGES/DRESSINGS) ×1 IMPLANT
NDL HPO THNWL 1X22GA REG BVL (NEEDLE) ×1 IMPLANT
NEEDLE SAFETY 22GX1 (NEEDLE) ×3
NS IRRIG 500ML POUR BTL (IV SOLUTION) ×3 IMPLANT
PACK LAP CHOLECYSTECTOMY (MISCELLANEOUS) ×3 IMPLANT
PENCIL ELECTRO HAND CTR (MISCELLANEOUS) ×3 IMPLANT
RELOAD 45 VASCULAR/THIN (ENDOMECHANICALS) IMPLANT
RELOAD STAPLE 45 2.5 WHT GRN (ENDOMECHANICALS) ×1 IMPLANT
RELOAD STAPLE 45 3.5 BLU ETS (ENDOMECHANICALS) ×1 IMPLANT
RELOAD STAPLE TA45 3.5 REG BLU (ENDOMECHANICALS) ×3 IMPLANT
SCISSORS METZENBAUM CVD 33 (INSTRUMENTS) ×1 IMPLANT
SLEEVE ADV FIXATION 5X100MM (TROCAR) ×5 IMPLANT
SLEEVE ENDOPATH XCEL 5M (ENDOMECHANICALS) ×1 IMPLANT
SUT MNCRL 4-0 (SUTURE) ×3
SUT MNCRL 4-0 27XMFL (SUTURE) ×1
SUT VICRYL 0 AB UR-6 (SUTURE) ×3 IMPLANT
SUTURE MNCRL 4-0 27XMF (SUTURE) ×1 IMPLANT
TRAY FOLEY W/METER SILVER 16FR (SET/KITS/TRAYS/PACK) ×3 IMPLANT
TROCAR 130MM GELPORT  DAV (MISCELLANEOUS) ×1 IMPLANT
TROCAR XCEL BLUNT TIP 100MML (ENDOMECHANICALS) ×2 IMPLANT
TROCAR Z-THREAD OPTICAL 5X100M (TROCAR) ×2 IMPLANT
TUBING INSUFFLATOR HI FLOW (MISCELLANEOUS) ×3 IMPLANT

## 2016-07-29 NOTE — ED Notes (Signed)
Pharm called to verify sending meds, will send shortly per pharm

## 2016-07-29 NOTE — ED Notes (Signed)
Back from ct scan  Pain free at present

## 2016-07-29 NOTE — ED Notes (Signed)
See triage note  States he developed some slight mid abd pain last pm  Describes as "burning" then this am the burning became worse..  Had some n/v PTA arrival   While waiting for room states pain eased up

## 2016-07-29 NOTE — ED Notes (Signed)
Resting at present. Friend at bedside  

## 2016-07-29 NOTE — ED Provider Notes (Signed)
[facility] Emergency Department Provider Note   ____________________________________________   First MD Initiated Contact with Patient 07/29/16 1404     (approximate)  I have reviewed the triage vital signs and the nursing notes.   HISTORY  Chief Complaint Abdominal Pain   HPI Timothy Pearson is a 30 y.o. male with a history of anxiety and hiatal hernia who is presenting to the emergency department today with lower abdominal pain. He says the pain started last night but progressed throughout the morning. He was nauseous and forced himself to vomit 1 time. He says that the vomit was yellow. He says that he also felt constipated Rheumatrex prune juice and had a normal, brown bowel movement. He says that he urinated for urine sample in the hospital, emergency department waiting room and now his pain is gone except for distal light pain to the right lower quadrant. He says that his pain was a 10 out of 10 before but only hurts now when he pushes on the lower abdomen. He denies any burning with urination. No penile or testicular pain.   Past Medical History:  Diagnosis Date  . Anxiety   . History of hiatal hernia     Patient Active Problem List   Diagnosis Date Noted  . Bipolar affective disorder, depressed, severe (HCC) 03/30/2015  . Overdose 03/30/2015    Past Surgical History:  Procedure Laterality Date  . NO PAST SURGERIES      Prior to Admission medications   Medication Sig Start Date End Date Taking? Authorizing Provider  busPIRone (BUSPAR) 15 MG tablet Take 1 tablet (15 mg total) by mouth 2 (two) times daily. For anxiety 03/18/15   Sanjuana KavaAgnes I Nwoko, NP  citalopram (CELEXA) 40 MG tablet Take 1 tablet (40 mg total) by mouth daily. For depression 03/18/15   Sanjuana KavaAgnes I Nwoko, NP  divalproex (DEPAKOTE) 250 MG DR tablet Take 1 tablet (250 mg) in the morning & 2 tablets (500 mg) at bedtime: For mood stabilization 03/18/15   Sanjuana KavaAgnes I Nwoko, NP  gabapentin  (NEURONTIN) 300 MG capsule Take 1 capsule (300 mg total) by mouth 3 (three) times daily. For agitation 03/18/15   Sanjuana KavaAgnes I Nwoko, NP  HYDROcodone-acetaminophen (NORCO/VICODIN) 5-325 MG per tablet Take 1-2 tablets by mouth every 6 (six) hours as needed for moderate pain. Patient not taking: Reported on 03/30/2015 03/26/15   Vanetta MuldersScott Zackowski, MD  hydrOXYzine (ATARAX/VISTARIL) 50 MG tablet Take 1 tablet (50 mg total) by mouth every 4 (four) hours as needed for anxiety (Sleep). 03/18/15   Sanjuana KavaAgnes I Nwoko, NP  nicotine (NICODERM CQ - DOSED IN MG/24 HOURS) 21 mg/24hr patch Place 1 patch (21 mg total) onto the skin daily at 6 (six) AM. For nicotine addiction Patient not taking: Reported on 03/30/2015 03/18/15   Sanjuana KavaAgnes I Nwoko, NP  traZODone (DESYREL) 150 MG tablet Take 1 tablet (150 mg total) by mouth at bedtime. For sleep 03/18/15   Sanjuana KavaAgnes I Nwoko, NP    Allergies Ibuprofen  No family history on file.  Social History Social History  Substance Use Topics  . Smoking status: Current Every Day Smoker    Packs/day: 0.50    Types: Cigarettes  . Smokeless tobacco: Never Used  . Alcohol use Yes     Comment: today    Review of Systems Constitutional: No fever/chills Eyes: No visual changes. ENT: No sore throat. Cardiovascular: Denies chest pain. Respiratory: Denies shortness of breath. Gastrointestinal:  No diarrhea.   Genitourinary: Negative for dysuria.  Musculoskeletal: Negative for back pain. Skin: Negative for rash. Neurological: Negative for headaches, focal weakness or numbness.  10-point ROS otherwise negative.  ____________________________________________   PHYSICAL EXAM:  VITAL SIGNS: ED Triage Vitals  Enc Vitals Group     BP 07/29/16 1213 131/78     Pulse Rate 07/29/16 1213 74     Resp 07/29/16 1213 16     Temp 07/29/16 1213 97.8 F (36.6 C)     Temp Source 07/29/16 1213 Oral     SpO2 07/29/16 1213 100 %     Weight 07/29/16 1214 150 lb (68 kg)     Height 07/29/16 1214 5\' 8"  (1.727  m)     Head Circumference --      Peak Flow --      Pain Score 07/29/16 1214 10     Pain Loc --      Pain Edu? --      Excl. in GC? --     Constitutional: Alert and oriented. Well appearing and in no acute distress. Eyes: Conjunctivae are normal. PERRL. EOMI. Head: Atraumatic. Nose: No congestion/rhinnorhea. Mouth/Throat: Mucous membranes are moist.   Neck: No stridor.   Cardiovascular: Normal rate, regular rhythm. Grossly normal heart sounds.   Respiratory: Normal respiratory effort.  No retractions. Lungs CTAB. Gastrointestinal: Soft with right lower quadrant abdominal pain over McBurney's point. No distention. No  No CVA tenderness. Musculoskeletal: No lower extremity tenderness nor edema.  No joint effusions. Neurologic:  Normal speech and language. No gross focal neurologic deficits are appreciated. No gait instability. Skin:  Skin is warm, dry and intact. No rash noted. Psychiatric: Mood and affect are normal. Speech and behavior are normal.  ____________________________________________   LABS (all labs ordered are listed, but only abnormal results are displayed)  Labs Reviewed  COMPREHENSIVE METABOLIC PANEL - Abnormal; Notable for the following:       Result Value   Sodium 134 (*)    Potassium 3.4 (*)    CO2 20 (*)    Glucose, Bld 103 (*)    Creatinine, Ser 1.33 (*)    All other components within normal limits  CBC - Abnormal; Notable for the following:    WBC 15.6 (*)    All other components within normal limits  URINALYSIS COMPLETEWITH MICROSCOPIC ([facility] ONLY) - Abnormal; Notable for the following:    Color, Urine YELLOW (*)    APPearance CLEAR (*)    Ketones, ur 2+ (*)    All other components within normal limits  LIPASE, BLOOD   ____________________________________________  EKG   ____________________________________________  RADIOLOGY  CT Abdomen Pelvis W Contrast (Accession [id number]) (Order [id number])  Imaging  Date: 07/29/2016 Department:  [facility] EMERGENCY DEPARTMENT Released By/Authorizing: Myrna Blazer, MD (auto-released)  Exam Information   Status Exam Begun  Exam Ended   Final [99] 07/29/2016 3:50 PM 07/29/2016 4:06 PM  PACS Images   Show images for CT Abdomen Pelvis W Contrast  Study Result   CLINICAL DATA:  Right lower quadrant abdominal pain, nausea and vomiting  EXAM: CT ABDOMEN AND PELVIS WITH CONTRAST  TECHNIQUE: Multidetector CT imaging of the abdomen and pelvis was performed using the standard protocol following bolus administration of intravenous contrast.  CONTRAST:  ISOVUE-300 IOPAMIDOL (ISOVUE-300) INJECTION 61%  COMPARISON:  CT abdomen pelvis of 03/26/2015  FINDINGS: Lower chest: The lung bases are clear.  Hepatobiliary: The liver enhances with no focal abnormality and no ductal dilatation is seen. No calcified gallstones are noted.  Pancreas: The pancreas is normal in size and the pancreatic duct is not dilated.  Spleen: The spleen is unremarkable.  Adrenals/Urinary Tract: The adrenal glands appear normal in size and shape. The kidneys enhance with no calculus or mass. No hydronephrosis is seen. The distal ureters are normal in caliber and no distal ureteral calculus is noted. The urinary bladder is moderately urine distended with no abnormality present.  Stomach/Bowel: The stomach is moderately distended with oral contrast with no abnormality noted. No small bowel distention is seen. The colon is largely decompressed. The appendix is noted to be edematous within the right lower quadrant -right pelvis extending medially filled with some fluid. The appendix measures 10 mm in diameter, and these findings are suggestive of early appendicitis. No periappendiceal strandiness is seen and no abscess is noted. Thre does appear to be slight enhancement of the edematous wall of the appendix.  Vascular/Lymphatic: The abdominal aorta is  normal in caliber. No adenopathy is seen.  Reproductive: The prostate is normal in size for age.  Other: None  Musculoskeletal: The lumbar vertebrae are in normal alignment with normal intervertebral disc spaces.  IMPRESSION: 1. Fluid filled dilated appendix with edematous enhancing wall consistent with early appendicitis. No complicating features are seen. 2. No renal or ureteral calculi are seen. Critical Value/emergent results were called by telephone at the time of interpretation on 07/29/2016 at 4:17 pm to Dr. Gladstone Pih , who verbally acknowledged these results.   Electronically Signed   By: Dwyane Dee M.D.   On: 07/29/2016 16:17    ____________________________________________   PROCEDURES  Procedure(s) performed:   Procedures  Critical Care performed:   ____________________________________________   INITIAL IMPRESSION / ASSESSMENT AND PLAN / ED COURSE  Pertinent labs & imaging results that were available during my care of the patient were reviewed by me and considered in my medical decision making (see chart for details).  ----------------------------------------- 1414 PM on 07/29/2016 ----------------------------------------- Discussed case with Dr. Ludwig Lean of surgery who recommends pursuing a CAT scan before she evaluates the patient. I did discuss the case with Dr. Ludwig Lean and she is aware that I'm suspecting appendicitis.   Clinical Course    ----------------------------------------- 430 PM on 07/29/2016 ----------------------------------------- Dr. Ludwig Lean reached after several attempts with busy signal on the [facility].  She is currently in a procedure but will be down in the emergency department to see the patient after the procedure. I splinted the patient as well as his friend the patient's diagnosis as well as the treatment plan and admission to the hospital. He is understanding what to comply. Again, asked if he required pain  medication at this time. He denies. However, he does say that he needs something    to help calm his nerves because he is a cigarette smoker.____________________________________________   FINAL CLINICAL IMPRESSION(S) / ED DIAGNOSES  Acute appendicitis.    NEW MEDICATIONS STARTED DURING THIS VISIT:  New Prescriptions   No medications on file     Note:  This document was prepared using Dragon voice recognition software and may include unintentional dictation errors.    Myrna Blazer, MD 07/29/16 [phone]

## 2016-07-29 NOTE — ED Triage Notes (Addendum)
[facility]  States pain is peri umbillical and epigastric area  Pos n/v  States pain started about 5 am

## 2016-07-29 NOTE — H&P (Signed)
Date of Admission:  07/29/2016  Reason for Admission:  Acute appendicitis  History of Present Illness: Timothy Pearson is a 30 y.o. male who presents with a one-day history of abdominal pain associated with nausea and vomiting. Patient reports that the pain started last night and continued this morning. He tried forcing himself to vomit this morning but reports that only made his pain worse. He also took prune juice and did have bowel movement but that it did not improve his pain either. His pain is located in the periumbilical region and has radiated to the right lower quadrant. Patient denies any fevers, chills, chest pain, shortness of breath, constipation, diarrhea, dysuria, hematuria, or blood in the stools.  Past Medical History: Past Medical History:  Diagnosis Date  . Anxiety   . History of hiatal hernia      Past Surgical History: Past Surgical History:  Procedure Laterality Date  . NO PAST SURGERIES      Home Medications: None  Allergies: Allergies  Allergen Reactions  . Ibuprofen Swelling    Social History:  reports that he has been smoking Cigarettes.  He has been smoking about 0.50 packs per day. He has never used smokeless tobacco. He reports that he drinks alcohol. He reports that he uses drugs, including Marijuana.   Family History: No family history on file.  Review of Systems: Review of Systems  Constitutional: Negative for chills and fever.  Eyes: Negative for blurred vision.  Respiratory: Negative for cough and shortness of breath.   Cardiovascular: Negative for chest pain and leg swelling.  Gastrointestinal: Positive for abdominal pain, nausea and vomiting. Negative for blood in stool, constipation, diarrhea and heartburn.  Genitourinary: Negative for dysuria and hematuria.  Musculoskeletal: Negative for myalgias.  Skin: Negative for rash.  Neurological: Negative for dizziness and headaches.  Psychiatric/Behavioral: Negative for depression.     Physical Exam BP 137/81 (BP Location: Right Arm)   Pulse 83   Temp 97.8 F (36.6 C) (Oral)   Resp 18   Ht 5\' 8"  (1.727 m)   Wt 68 kg (150 lb)   SpO2 100%   BMI 22.81 kg/m  CONSTITUTIONAL: No acute distress HEENT:  Normocephalic, atraumatic, extraocular motion intact. NECK: Trachea is midline, and there is no jugular venous distension.  LYMPH NODES:  Lymph nodes in the neck are not enlarged. RESPIRATORY:  Lungs are clear, and breath sounds are equal bilaterally. Normal respiratory effort without pathologic use of accessory muscles. CARDIOVASCULAR: Heart is regular without murmurs, gallops, or rubs. GI: The abdomen is soft, nondistended, with tenderness to palpation in the periumbilical and right lower quadrant regions. There were no palpable masses.  MUSCULOSKELETAL:  Normal muscle strength and tone in all four extremities.  No peripheral edema or cyanosis. SKIN: Skin turgor is normal. There are no pathologic skin lesions.  NEUROLOGIC:  Motor and sensation is grossly normal.  Cranial nerves are grossly intact. PSYCH:  Alert and oriented to person, place and time. Affect is normal.  Laboratory Analysis: Results for orders placed or performed during the hospital encounter of 07/29/16 (from the past 24 hour(s))  Lipase, blood     Status: None   Collection Time: 07/29/16 12:15 PM  Result Value Ref Range   Lipase 19 11 - 51 U/L  Comprehensive metabolic panel     Status: Abnormal   Collection Time: 07/29/16 12:15 PM  Result Value Ref Range   Sodium 134 (L) 135 - 145 mmol/L   Potassium 3.4 (L) 3.5 - 5.1  mmol/L   Chloride 102 101 - 111 mmol/L   CO2 20 (L) 22 - 32 mmol/L   Glucose, Bld 103 (H) 65 - 99 mg/dL   BUN 19 6 - 20 mg/dL   Creatinine, Ser 1.61 (H) 0.61 - 1.24 mg/dL   Calcium 9.4 8.9 - 09.6 mg/dL   Total Protein 8.0 6.5 - 8.1 g/dL   Albumin 4.7 3.5 - 5.0 g/dL   AST 24 15 - 41 U/L   ALT 23 17 - 63 U/L   Alkaline Phosphatase 51 38 - 126 U/L   Total Bilirubin 0.9 0.3 -  1.2 mg/dL   GFR calc non Af Amer >60 >60 mL/min   GFR calc Af Amer >60 >60 mL/min   Anion gap 12 5 - 15  CBC     Status: Abnormal   Collection Time: 07/29/16 12:15 PM  Result Value Ref Range   WBC 15.6 (H) 3.8 - 10.6 K/uL   RBC 4.64 4.40 - 5.90 MIL/uL   Hemoglobin 15.5 13.0 - 18.0 g/dL   HCT 04.5 40.9 - 81.1 %   MCV 96.8 80.0 - 100.0 fL   MCH 33.4 26.0 - 34.0 pg   MCHC 34.6 32.0 - 36.0 g/dL   RDW 91.4 78.2 - 95.6 %   Platelets 255 150 - 440 K/uL  Urinalysis complete, with microscopic     Status: Abnormal   Collection Time: 07/29/16 12:15 PM  Result Value Ref Range   Color, Urine YELLOW (A) YELLOW   APPearance CLEAR (A) CLEAR   Glucose, UA NEGATIVE NEGATIVE mg/dL   Bilirubin Urine NEGATIVE NEGATIVE   Ketones, ur 2+ (A) NEGATIVE mg/dL   Specific Gravity, Urine 1.028 1.005 - 1.030   Hgb urine dipstick NEGATIVE NEGATIVE   pH 5.0 5.0 - 8.0   Protein, ur NEGATIVE NEGATIVE mg/dL   Nitrite NEGATIVE NEGATIVE   Leukocytes, UA NEGATIVE NEGATIVE   RBC / HPF 0-5 0 - 5 RBC/hpf   WBC, UA 0-5 0 - 5 WBC/hpf   Bacteria, UA NONE SEEN NONE SEEN   Squamous Epithelial / LPF NONE SEEN NONE SEEN   Mucous PRESENT     Imaging: Ct Abdomen Pelvis W Contrast  Result Date: 07/29/2016 CLINICAL DATA:  Right lower quadrant abdominal pain, nausea and vomiting EXAM: CT ABDOMEN AND PELVIS WITH CONTRAST TECHNIQUE: Multidetector CT imaging of the abdomen and pelvis was performed using the standard protocol following bolus administration of intravenous contrast. CONTRAST:  ISOVUE-300 IOPAMIDOL (ISOVUE-300) INJECTION 61% COMPARISON:  CT abdomen pelvis of 03/26/2015 FINDINGS: Lower chest: The lung bases are clear. Hepatobiliary: The liver enhances with no focal abnormality and no ductal dilatation is seen. No calcified gallstones are noted. Pancreas: The pancreas is normal in size and the pancreatic duct is not dilated. Spleen: The spleen is unremarkable. Adrenals/Urinary Tract: The adrenal glands appear normal  in size and shape. The kidneys enhance with no calculus or mass. No hydronephrosis is seen. The distal ureters are normal in caliber and no distal ureteral calculus is noted. The urinary bladder is moderately urine distended with no abnormality present. Stomach/Bowel: The stomach is moderately distended with oral contrast with no abnormality noted. No small bowel distention is seen. The colon is largely decompressed. The appendix is noted to be edematous within the right lower quadrant -right pelvis extending medially filled with some fluid. The appendix measures 10 mm in diameter, and these findings are suggestive of early appendicitis. No periappendiceal strandiness is seen and no abscess is noted. Thre does appear  to be slight enhancement of the edematous wall of the appendix. Vascular/Lymphatic: The abdominal aorta is normal in caliber. No adenopathy is seen. Reproductive: The prostate is normal in size for age. Other: None Musculoskeletal: The lumbar vertebrae are in normal alignment with normal intervertebral disc spaces. IMPRESSION: 1. Fluid filled dilated appendix with edematous enhancing wall consistent with early appendicitis. No complicating features are seen. 2. No renal or ureteral calculi are seen. Critical Value/emergent results were called by telephone at the time of interpretation on 07/29/2016 at 4:17 pm to Dr. Gladstone PihAVID SCHAEVITZ , who verbally acknowledged these results. Electronically Signed   By: Dwyane DeePaul  Barry M.D.   On: 07/29/2016 16:17    Assessment and Plan: This is a 30 y.o. male who presents with acute appendicitis. I personally reviewed all the laboratory studies and imaging studies and discussed them with the patient. He will be kept nothing by mouth with IV fluid hydration and IV pain medication. He already received a dose of IV cefoxitin in the emergency room.  He will be taken to the operating room tonight for a laparoscopic appendectomy. I discussed with the patient the risks of the  procedure including bleeding, abscess formation or infection, and injury to surrounding structures. He understands the plan and all his questions have been answered.   Howie IllJose Luis Cesilia Shinn, MD [facility]

## 2016-07-29 NOTE — ED Notes (Signed)
Patient transported to CT 

## 2016-07-29 NOTE — Anesthesia Procedure Notes (Signed)
Procedure Name: LMA Insertion Date/Time: 07/29/2016 11:57 PM Performed by: Irving BurtonBACHICH, Tallula Grindle Pre-anesthesia Checklist: Patient identified, Patient being monitored, Timeout performed, Emergency Drugs available and Suction available Patient Re-evaluated:Patient Re-evaluated prior to inductionOxygen Delivery Method: Circle system utilized Preoxygenation: Pre-oxygenation with 100% oxygen Intubation Type: IV induction, Cricoid Pressure applied and Rapid sequence Laryngoscope Size: Mac and 4 Grade View: Grade II Tube type: Oral Tube size: 7.5 mm Number of attempts: 2 Placement Confirmation: positive ETCO2 and breath sounds checked- equal and bilateral Secured at: 24 cm Tube secured with: Tape Dental Injury: Teeth and Oropharynx as per pre-operative assessment

## 2016-07-29 NOTE — Anesthesia Preprocedure Evaluation (Addendum)
Anesthesia Evaluation  Patient identified by MRN, date of birth, ID band Patient awake    Reviewed: Allergy & Precautions, H&P , NPO status , Patient's Chart, lab work & pertinent test results, reviewed documented beta blocker date and time   History of Anesthesia Complications Negative for: history of anesthetic complications  Airway Mallampati: II  TM Distance: >3 FB Neck ROM: full    Dental no notable dental hx. (+) Caps, Teeth Intact   Pulmonary neg shortness of breath, neg sleep apnea, neg COPD, Recent URI , Residual Cough, Current Smoker,    Pulmonary exam normal breath sounds clear to auscultation       Cardiovascular Exercise Tolerance: Good negative cardio ROS Normal cardiovascular exam Rhythm:regular Rate:Normal     Neuro/Psych PSYCHIATRIC DISORDERS (Anxiety) negative neurological ROS     GI/Hepatic Neg liver ROS, hiatal hernia,   Endo/Other  negative endocrine ROS  Renal/GU negative Renal ROS  negative genitourinary   Musculoskeletal   Abdominal   Peds  Hematology negative hematology ROS (+)   Anesthesia Other Findings Past Medical History: No date: Anxiety No date: History of hiatal hernia   Reproductive/Obstetrics negative OB ROS                            Anesthesia Physical Anesthesia Plan  ASA: II  Anesthesia Plan: General, Rapid Sequence and Cricoid Pressure   Post-op Pain Management:    Induction:   Airway Management Planned:   Additional Equipment:   Intra-op Plan:   Post-operative Plan:   Informed Consent: I have reviewed the patients History and Physical, chart, labs and discussed the procedure including the risks, benefits and alternatives for the proposed anesthesia with the patient or authorized representative who has indicated his/her understanding and acceptance.   Dental Advisory Given  Plan Discussed with: Anesthesiologist, CRNA and  Surgeon  Anesthesia Plan Comments:         Anesthesia Quick Evaluation

## 2016-07-29 NOTE — ED Notes (Signed)
Pt unable to urinate at this moment,given specimen cup for when is able to void. Pt sent back to lobby. 

## 2016-07-30 ENCOUNTER — Encounter: Payer: Self-pay | Admitting: Surgery

## 2016-07-30 LAB — SURGICAL PCR SCREEN
MRSA, PCR: NEGATIVE
STAPHYLOCOCCUS AUREUS: NEGATIVE

## 2016-07-30 MED ORDER — LIDOCAINE HCL (CARDIAC) 20 MG/ML IV SOLN
INTRAVENOUS | Status: DC | PRN
  Administered 2016-07-29: 80 mg via INTRAVENOUS

## 2016-07-30 MED ORDER — PROMETHAZINE HCL 25 MG/ML IJ SOLN: 6.2500 mg | INTRAMUSCULAR | Status: DC | PRN

## 2016-07-30 MED ORDER — FENTANYL CITRATE (PF) 100 MCG/2ML IJ SOLN
INTRAMUSCULAR | Status: DC | PRN
  Administered 2016-07-29 – 2016-07-30 (×2): 50 ug via INTRAVENOUS

## 2016-07-30 MED ORDER — DEXAMETHASONE SODIUM PHOSPHATE 10 MG/ML IJ SOLN
INTRAMUSCULAR | Status: DC | PRN
  Administered 2016-07-30: 10 mg via INTRAVENOUS

## 2016-07-30 MED ORDER — SUGAMMADEX SODIUM 500 MG/5ML IV SOLN
INTRAVENOUS | Status: DC | PRN
  Administered 2016-07-30: 280 mg via INTRAVENOUS

## 2016-07-30 MED ORDER — GUAIFENESIN-DM 100-10 MG/5ML PO SYRP
10.0000 mL | ORAL_SOLUTION | ORAL | Status: DC | PRN
  Administered 2016-07-30: 10 mL via ORAL
  Filled 2016-07-30: qty 10

## 2016-07-30 MED ORDER — ACETAMINOPHEN 10 MG/ML IV SOLN
INTRAVENOUS | Status: DC | PRN
  Administered 2016-07-30: 1000 mg via INTRAVENOUS

## 2016-07-30 MED ORDER — PROPOFOL 10 MG/ML IV BOLUS
INTRAVENOUS | Status: DC | PRN
  Administered 2016-07-29: 150 mg via INTRAVENOUS

## 2016-07-30 MED ORDER — ROCURONIUM BROMIDE 100 MG/10ML IV SOLN
INTRAVENOUS | Status: DC | PRN
  Administered 2016-07-30: 20 mg via INTRAVENOUS
  Administered 2016-07-30: 30 mg via INTRAVENOUS

## 2016-07-30 MED ORDER — FENTANYL CITRATE (PF) 100 MCG/2ML IJ SOLN
INTRAMUSCULAR | Status: AC
  Administered 2016-07-30: 25 ug
  Filled 2016-07-30: qty 2

## 2016-07-30 MED ORDER — HYDROCODONE-ACETAMINOPHEN 5-325 MG PO TABS: 1.0000 | ORAL_TABLET | Freq: Four times a day (QID) | ORAL | 0 refills | Status: DC | PRN

## 2016-07-30 MED ORDER — DEXMEDETOMIDINE HCL IN NACL 200 MCG/50ML IV SOLN
INTRAVENOUS | Status: DC | PRN
  Administered 2016-07-30: 8 ug via INTRAVENOUS

## 2016-07-30 MED ORDER — SUCCINYLCHOLINE CHLORIDE 20 MG/ML IJ SOLN
INTRAMUSCULAR | Status: DC | PRN
  Administered 2016-07-29: 80 mg via INTRAVENOUS

## 2016-07-30 MED ORDER — FENTANYL CITRATE (PF) 100 MCG/2ML IJ SOLN
25.0000 ug | INTRAMUSCULAR | Status: DC | PRN
  Administered 2016-07-30 (×3): 25 ug via INTRAVENOUS

## 2016-07-30 MED ORDER — BUPIVACAINE-EPINEPHRINE (PF) 0.25% -1:200000 IJ SOLN
INTRAMUSCULAR | Status: DC | PRN
  Administered 2016-07-30: 20 mL via PERINEURAL

## 2016-07-30 NOTE — Transfer of Care (Signed)
Immediate Anesthesia Transfer of Care Note  Patient: Timothy Pearson  Procedure(s) Performed: Procedure(s): APPENDECTOMY LAPAROSCOPIC (N/A)  Patient Location: PACU  Anesthesia Type:General  Level of Consciousness: awake and alert   Airway & Oxygen Therapy: Patient Spontanous Breathing  Post-op Assessment: Report given to RN and Post -op Vital signs reviewed and stable  Post vital signs: stable  Last Vitals:  Vitals:   07/29/16 2144 07/30/16 0112  BP: 131/79 (!) 156/98  Pulse: 67 (!) 122  Resp: 20 14  Temp: 37.1 C 36.5 C    Last Pain:  Vitals:   07/30/16 0112  TempSrc: Tympanic  PainSc:          Complications: No apparent anesthesia complications

## 2016-07-30 NOTE — Progress Notes (Signed)
MD ordered patient to be discharged home.  Discharge instructions were reviewed with the patient and he voiced understanding.  Follow-up appointment was made.  Prescription given to the patient.  IV was removed with catheter intact.  All patients questions were answered.  Patient refused to go out in a wheelchair so he walked out with his significant other.   

## 2016-07-30 NOTE — Care Management (Signed)
Patient self pay patient.  Admitted status post lap appy.  [place]  [facility]  Patient to discharge with vicodin,  Coupon provided from [employer]rx[employer]com for out of pocket expense of $6.88  RNCM signing off

## 2016-07-30 NOTE — Anesthesia Postprocedure Evaluation (Signed)
Anesthesia Post Note  Patient: Timothy Pearson  Procedure(s) Performed: Procedure(s) (LRB): APPENDECTOMY LAPAROSCOPIC (N/A)  Patient location during evaluation: PACU Anesthesia Type: General Level of consciousness: awake and alert Pain management: pain level controlled Vital Signs Assessment: post-procedure vital signs reviewed and stable Respiratory status: spontaneous breathing, nonlabored ventilation and respiratory function stable Cardiovascular status: blood pressure returned to baseline and stable Postop Assessment: no signs of nausea or vomiting Anesthetic complications: no    Last Vitals:  Vitals:   07/30/16 0245 07/30/16 0407  BP: 130/83 (!) 148/76  Pulse: 79 79  Resp: 20 20  Temp: 36.8 C 36.9 C    Last Pain:  Vitals:   07/30/16 0614  TempSrc:   PainSc: 8                  Lenard SimmerAndrew Damiyah Ditmars

## 2016-07-30 NOTE — Progress Notes (Signed)
Patient discharged via charge nurse.

## 2016-07-30 NOTE — Op Note (Signed)
  Procedure Date:  07/30/2016  Pre-operative Diagnosis:  Acute appendicitis  Post-operative Diagnosis: Acute appendicitis  Procedure:  Laparoscopic appendectomy  Surgeon:  Howie IllJose Luis Deng Kemler, MD  Anesthesia:  General endotracheal  Estimated Blood Loss:  5 ml  Specimens:  appendix  Complications:  None  Indications for Procedure:  This is a 30 y.o. male who presents with abdominal pain and workup revealing acute appendicitis.  The options of surgery versus observation were reviewed with the patient and/or family. The risks of bleeding, infection, recurrence of symptoms, negative laparoscopy, potential for an open procedure, bowel injury, abscess or infection, were all discussed with the patient and he was willing to proceed.  Description of Procedure: The patient was correctly identified in the preoperative area and brought into the operating room.  The patient was placed supine with VTE prophylaxis in place.  Appropriate time-outs were performed.  Anesthesia was induced and the patient was intubated.  Foley catheter was placed.  Appropriate antibiotics were infused.  The abdomen was prepped and draped in a sterile fashion. An infraumbilical incision was made. A cutdown technique was used to enter the abdominal cavity without injury, and a Hasson trocar was inserted.  Pneumoperitoneum was obtained with appropriate opening pressures.  Two 5-mm ports were placed in the suprapubic and left lateral positions under direct visualization.  The right lower quadrant was inspected and the appendix was identified and found to be acutely inflamed.  The appendix was carefully dissected. The base of the appendix was dissected out and divided with a standard load Endo GIA. The mesoappendix was divided using the LigaSure.  The appendix was placed in an Endocatch bag and brought out through the umbilical incision.  The right lower quadrant was then inspected again revealing an intact staple line, no bleeding,  and no bowel injury.  The area was thoroughly irrigated.  The 5 mm ports were removed under direct visualization and the Hasson trocar was removed.  The fascial opening was closed using 0 vicryl suture.  Local anesthetic was infused in all incisions and the incisions were closed with 4-0 Monocryl.  The wounds were cleaned and sealed with DermaBond.  Foley catheter was removed and the patient was emerged from anesthesia and extubated and brought to the recovery room for further management.  The patient tolerated the procedure well and all counts were correct at the end of the case.   Howie IllJose Luis Novah Nessel, MD

## 2016-07-31 NOTE — Discharge Summary (Signed)
Physician Discharge Summary  Patient ID: Timothy Pearson MRN: [id number] DOB/AGE: 771-May-1987 30 y.o.  Admit date: 07/29/2016 Discharge date: 07/31/2016  Admission Diagnoses: Acute appendicitis   Discharge Diagnoses:  Active Problems:   Acute appendicitis   Discharged Condition: good  Hospital Course: 30 yr old with acute appendicitis had Lap appy with Dr. Aleen CampiPiscoya on 10/5.  Patient did well postop, tolerating regular diet, up and walking around, pain well controlled with PO meds.    Consults: None  Significant Diagnostic Studies: CT scan  Treatments: surgery: Lap appy  Discharge Exam: Blood pressure 131/77, pulse 81, temperature 98.2 F (36.8 C), temperature source Oral, resp. rate 19, height 5\' 8"  (1.727 m), weight 150 lb (68 kg), SpO2 99 %. General appearance: alert, cooperative and no distress GI: soft, appropriately tender, incisions c/d/i, no erythema or drainage Extremities: extremities normal, atraumatic, no cyanosis or edema  Disposition: 01-Home or Self Care  Discharge Instructions    Call MD for:  difficulty breathing, headache or visual disturbances    Complete by:  As directed    Call MD for:  persistant nausea and vomiting    Complete by:  As directed    Call MD for:  redness, tenderness, or signs of infection (pain, swelling, redness, odor or green/yellow discharge around incision site)    Complete by:  As directed    Call MD for:  severe uncontrolled pain    Complete by:  As directed    Call MD for:  temperature >100.4    Complete by:  As directed    Diet - low sodium heart healthy    Complete by:  As directed    Diet general    Complete by:  As directed    Increase activity slowly    Complete by:  As directed    Lifting restrictions    Complete by:  As directed    No lifting over 15lbs for 4 weeks   May shower / Bathe    Complete by:  As directed    No dressing needed    Complete by:  As directed        Medication List    TAKE these  medications   busPIRone 15 MG tablet Commonly known as:  BUSPAR Take 1 tablet (15 mg total) by mouth 2 (two) times daily. For anxiety   citalopram 40 MG tablet Commonly known as:  CELEXA Take 1 tablet (40 mg total) by mouth daily. For depression   divalproex 250 MG DR tablet Commonly known as:  DEPAKOTE Take 1 tablet (250 mg) in the morning & 2 tablets (500 mg) at bedtime: For mood stabilization   gabapentin 300 MG capsule Commonly known as:  NEURONTIN Take 1 capsule (300 mg total) by mouth 3 (three) times daily. For agitation   HYDROcodone-acetaminophen 5-325 MG tablet Commonly known as:  NORCO/VICODIN Take 1-2 tablets by mouth every 6 (six) hours as needed for moderate pain.   hydrOXYzine 50 MG tablet Commonly known as:  ATARAX/VISTARIL Take 1 tablet (50 mg total) by mouth every 4 (four) hours as needed for anxiety (Sleep).   nicotine 21 mg/24hr patch Commonly known as:  NICODERM CQ - dosed in mg/24 hours Place 1 patch (21 mg total) onto the skin daily at 6 (six) AM. For nicotine addiction   traZODone 150 MG tablet Commonly known as:  DESYREL Take 1 tablet (150 mg total) by mouth at bedtime. For sleep      Follow-up Information    [facility] Follow up on 08/04/2016.   Specialty:  Surgery Why:  follow up with Dr.Piscoya on 08/04/16 at 2:00pm Contact information: [address]  [address] [place] [address] [phone]           Signed: Gladis Riffle 07/31/2016, 2:31 PM

## 2016-08-02 LAB — SURGICAL PATHOLOGY

## 2016-08-04 ENCOUNTER — Ambulatory Visit (INDEPENDENT_AMBULATORY_CARE_PROVIDER_SITE_OTHER): Payer: Self-pay | Admitting: Surgery

## 2016-08-04 ENCOUNTER — Encounter: Payer: Self-pay | Admitting: Surgery

## 2016-08-04 VITALS — BP 123/74 | HR 70 | Temp 98.1°F | Ht 68.0 in | Wt 152.0 lb

## 2016-08-04 DIAGNOSIS — K353 Acute appendicitis with localized peritonitis, without perforation or gangrene: Secondary | ICD-10-CM

## 2016-08-04 MED ORDER — HYDROCODONE-ACETAMINOPHEN 10-325 MG PO TABS: 1.0000 | ORAL_TABLET | Freq: Four times a day (QID) | ORAL | 0 refills | Status: DC | PRN

## 2016-08-04 MED ORDER — HYDROCODONE-ACETAMINOPHEN 5-325 MG PO TABS: 1.0000 | ORAL_TABLET | Freq: Four times a day (QID) | ORAL | 0 refills | Status: AC | PRN

## 2016-08-04 NOTE — Progress Notes (Signed)
08/04/2016  HPI: Patient is status post laparoscopic appendectomy on 10/6. He was discharged home on the next day in good condition. Today he presents for follow-up.he reports that he still having some pain around the incisions. However no right lower quadrant pain like before surgery.reports initial constipation but this has been improving. Denies any fevers, chest pain, shortness of breath, worsening abdominal pain, nausea, vomiting.  Vital signs: BP 123/74   Pulse 70   Temp 98.1 F (36.7 C) (Oral)   Ht 5\' 8"  (1.727 m)   Wt 68.9 kg (152 lb)   BMI 23.11 kg/m    Physical Exam: Constitutional: No acute distress Abdomen:soft, nondistended, appropriately tender over the incisions. Incisions are clean dry and intact with Dermabond in place still. There is minimal ecchymosis around the incisions. Otherwise no erythema or drainage.  Assessment/Plan: 30 year old male status post laparoscopic appendectomy on 10/6.  Patient's currently healing well and improving each day. He still having pain over the incisions.  We will refill the prescription for Vicodin that he had on discharge. We have also encouraged the patient to take MiraLAX as needed for constipation. We have advised the patient to call the office if he were to have any fevers chills worsening abdominal pain drainage from the wounds or redness from the around the incisions or other concerns. He may return to work when he feels comfortable with the restriction of no heavy lifting of more than 10 pounds for an additional 3 weeks.  Patient understands this plan and all his questions have been answered. He may follow-up with us on an as-needed basis.   Timothy IllJose Luis Shanaye Rief, MD [facility]

## 2016-08-04 NOTE — Patient Instructions (Signed)
Please use Miraalax daily to get your bowels moving on a regular cycle then you may use it as needed. Please call our office if you have any questions or concerns.

## 2017-03-31 ENCOUNTER — Encounter (HOSPITAL_COMMUNITY): Payer: Self-pay | Admitting: Vascular Surgery

## 2017-03-31 ENCOUNTER — Emergency Department (HOSPITAL_COMMUNITY)
Admission: EM | Admit: 2017-03-31 | Discharge: 2017-03-31 | Disposition: A | Discharge status: Home / Self Care | Payer: Self-pay | Attending: Emergency Medicine | Admitting: Emergency Medicine

## 2017-03-31 ENCOUNTER — Emergency Department (HOSPITAL_COMMUNITY): Payer: Self-pay

## 2017-03-31 DIAGNOSIS — Y92410 Unspecified street and highway as the place of occurrence of the external cause: Secondary | ICD-10-CM | POA: Insufficient documentation

## 2017-03-31 DIAGNOSIS — F17210 Nicotine dependence, cigarettes, uncomplicated: Secondary | ICD-10-CM | POA: Insufficient documentation

## 2017-03-31 DIAGNOSIS — M25461 Effusion, right knee: Secondary | ICD-10-CM

## 2017-03-31 DIAGNOSIS — S838X1A Sprain of other specified parts of right knee, initial encounter: Secondary | ICD-10-CM | POA: Insufficient documentation

## 2017-03-31 DIAGNOSIS — Y999 Unspecified external cause status: Secondary | ICD-10-CM | POA: Insufficient documentation

## 2017-03-31 DIAGNOSIS — Y9389 Activity, other specified: Secondary | ICD-10-CM | POA: Insufficient documentation

## 2017-03-31 MED ORDER — DICLOFENAC SODIUM 75 MG PO TBEC: 75.0000 mg | DELAYED_RELEASE_TABLET | Freq: Two times a day (BID) | ORAL | 0 refills | Status: AC

## 2017-03-31 NOTE — Progress Notes (Signed)
Orthopedic Tech Progress Note Patient Details:  Timothy Pearson 1986-01-31 [id number]  Ortho Devices Type of Ortho Device: Crutches, Knee Immobilizer   Timothy Pearson 03/31/2017, 4:51 PM
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
## 2017-03-31 NOTE — ED Notes (Signed)
See EDP secondary assessment.  

## 2017-03-31 NOTE — Discharge Instructions (Signed)
Return if any problems.
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
## 2017-04-24 ENCOUNTER — Emergency Department (HOSPITAL_COMMUNITY): Payer: Self-pay

## 2017-04-24 ENCOUNTER — Encounter (HOSPITAL_COMMUNITY): Payer: Self-pay | Admitting: Emergency Medicine

## 2017-04-24 ENCOUNTER — Emergency Department (HOSPITAL_COMMUNITY)
Admission: EM | Admit: 2017-04-24 | Discharge: 2017-04-24 | Disposition: A | Discharge status: Home / Self Care | Payer: Self-pay | Attending: Emergency Medicine | Admitting: Emergency Medicine

## 2017-04-24 DIAGNOSIS — F17200 Nicotine dependence, unspecified, uncomplicated: Secondary | ICD-10-CM | POA: Insufficient documentation

## 2017-04-24 DIAGNOSIS — M25561 Pain in right knee: Secondary | ICD-10-CM | POA: Insufficient documentation

## 2017-04-24 MED ORDER — ACETAMINOPHEN 325 MG PO TABS
650.0000 mg | ORAL_TABLET | Freq: Once | ORAL | Status: AC
  Administered 2017-04-24: 650 mg via ORAL
  Filled 2017-04-24: qty 2
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
## 2017-04-24 NOTE — Discharge Instructions (Signed)
You may use Tylenol and low-dose ibuprofen as needed for pain. You may apply the topical full-term gel as needed for pain. Use the knee immobilizer to prevent further injury. Use crutches to remain nonweightbearing until seen by orthopedics. Follow-up with orthopedics for further evaluation of your knee. Return to the emergency department if you develop fever, chills, or any new or worsening symptoms.
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
## 2017-04-25 ENCOUNTER — Encounter (INDEPENDENT_AMBULATORY_CARE_PROVIDER_SITE_OTHER): Payer: Self-pay | Admitting: Orthopaedic Surgery

## 2017-04-25 ENCOUNTER — Ambulatory Visit (INDEPENDENT_AMBULATORY_CARE_PROVIDER_SITE_OTHER): Payer: Self-pay | Admitting: Orthopaedic Surgery

## 2017-04-25 DIAGNOSIS — M25561 Pain in right knee: Secondary | ICD-10-CM | POA: Insufficient documentation

## 2017-04-25 MED ORDER — TRAMADOL HCL 50 MG PO TABS: 50.0000 mg | ORAL_TABLET | Freq: Four times a day (QID) | ORAL | 0 refills | Status: DC | PRN
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
## 2017-04-28 ENCOUNTER — Telehealth (INDEPENDENT_AMBULATORY_CARE_PROVIDER_SITE_OTHER): Payer: Self-pay | Admitting: *Deleted

## 2017-04-28 ENCOUNTER — Encounter (INDEPENDENT_AMBULATORY_CARE_PROVIDER_SITE_OTHER): Payer: Self-pay | Admitting: *Deleted

## 2017-04-28 NOTE — Telephone Encounter (Signed)
Pt has MRI appt scheduled on Friday July 13 at 1:00p, pt needs to arrive 15 mins early to register. Called pt on home number no answer, no vm, tried calling on Cell no vm. Will try again later time. Will send out a letter with appt information.

## 2017-04-29 NOTE — Telephone Encounter (Signed)
Pt called back and aware of appt 

## 2017-05-06 ENCOUNTER — Ambulatory Visit (HOSPITAL_COMMUNITY)
Admission: RE | Admit: 2017-05-06 | Discharge: 2017-05-06 | Disposition: A | Discharge status: Home / Self Care | Payer: Self-pay | Source: Ambulatory Visit | Attending: Orthopaedic Surgery | Admitting: Orthopaedic Surgery

## 2017-05-06 DIAGNOSIS — M25561 Pain in right knee: Secondary | ICD-10-CM

## 2017-05-06 DIAGNOSIS — M25461 Effusion, right knee: Secondary | ICD-10-CM | POA: Insufficient documentation

## 2017-05-06 DIAGNOSIS — S83511A Sprain of anterior cruciate ligament of right knee, initial encounter: Secondary | ICD-10-CM | POA: Insufficient documentation

## 2017-05-09 ENCOUNTER — Ambulatory Visit (INDEPENDENT_AMBULATORY_CARE_PROVIDER_SITE_OTHER): Payer: Self-pay | Admitting: Orthopaedic Surgery

## 2017-05-09 ENCOUNTER — Encounter (INDEPENDENT_AMBULATORY_CARE_PROVIDER_SITE_OTHER): Payer: Self-pay | Admitting: Orthopaedic Surgery

## 2017-05-09 DIAGNOSIS — S83511A Sprain of anterior cruciate ligament of right knee, initial encounter: Secondary | ICD-10-CM

## 2017-05-09 MED ORDER — TRAMADOL HCL 50 MG PO TABS: 50.0000 mg | ORAL_TABLET | Freq: Four times a day (QID) | ORAL | 2 refills | Status: AC | PRN

## 2017-05-09 NOTE — Progress Notes (Signed)
   Office Visit Note   Patient: Timothy Pearson           Date of Birth: Mar 27, 1986           MRN: [id number] Visit Date: 05/09/2017              Requested by: No referring provider defined for this encounter. PCP: Patient, No Pcp Per   Assessment & Plan: Visit Diagnoses:  1. Rupture of anterior cruciate ligament of right knee, initial encounter     Plan: MRI is consistent with anterior cruciate ligament and LCL tear. Hinged knee brace was given. Referral to Dr. August Saucerean.  Follow-Up Instructions: Return if symptoms worsen or fail to improve.   Orders:  No orders of the defined types were placed in this encounter.  Meds ordered this encounter  Medications  . traMADol (ULTRAM) 50 MG tablet    Sig: Take 1 tablet (50 mg total) by mouth every 6 (six) hours as needed.    Dispense:  30 tablet    Refill:  2      Procedures: No procedures performed   Clinical Data: No additional findings.   Subjective: Chief Complaint  Patient presents with  . Right Knee - Follow-up    Patient follows up today to review his MRI. He states he still has some chronic pain in his right knee. He is also exhibiting instability.    Review of Systems   Objective: Vital Signs: There were no vitals taken for this visit.  Physical Exam  Ortho Exam Right knee exam is unchanged. Specialty Comments:  No specialty comments available.  Imaging: No results found.   PMFS History: Patient Active Problem List   Diagnosis Date Noted  . Rupture of anterior cruciate ligament of right knee 05/09/2017  . Acute pain of right knee 04/25/2017  . Acute appendicitis 07/29/2016  . Acute appendicitis with localized peritonitis   . Bipolar affective disorder, depressed, severe (HCC) 03/30/2015  . Overdose 03/30/2015   Past Medical History:  Diagnosis Date  . Anxiety   . History of hiatal hernia     No family history on file.  Past Surgical History:  Procedure Laterality Date  . LAPAROSCOPIC  APPENDECTOMY N/A 07/29/2016   Procedure: APPENDECTOMY LAPAROSCOPIC;  Surgeon: Henrene DodgeJose Piscoya, MD;  Location: [facility] ORS;  Service: General;  Laterality: N/A;  . NO PAST SURGERIES     Social History   Occupational History  . Not on file.   Social History Main Topics  . Smoking status: Current Every Day Smoker    Packs/day: 0.50    Types: Cigarettes  . Smokeless tobacco: Never Used  . Alcohol use No     Comment: today  . Drug use: Yes    Types: Marijuana     Comment: "3 blunts/day"  . Sexual activity: Yes

## 2017-05-16 ENCOUNTER — Ambulatory Visit (INDEPENDENT_AMBULATORY_CARE_PROVIDER_SITE_OTHER): Payer: Self-pay | Admitting: Orthopedic Surgery

## 2019-08-26 DEATH — deceased
